# Patient Record
Sex: Female | Born: 1985
Health system: Southern US, Community
[De-identification: ages and names within clinical notes are randomized; demographics above are authoritative.]

## PROBLEM LIST (undated history)

## (undated) DIAGNOSIS — N6019 Diffuse cystic mastopathy of unspecified breast: Secondary | ICD-10-CM

## (undated) DIAGNOSIS — O09299 Supervision of pregnancy with other poor reproductive or obstetric history, unspecified trimester: Secondary | ICD-10-CM

## (undated) DIAGNOSIS — Z9889 Other specified postprocedural states: Secondary | ICD-10-CM

## (undated) DIAGNOSIS — D241 Benign neoplasm of right breast: Secondary | ICD-10-CM

## (undated) DIAGNOSIS — K649 Unspecified hemorrhoids: Secondary | ICD-10-CM

## (undated) HISTORY — PX: ANTERIOR CRUCIATE LIGAMENT REPAIR: SHX115

## (undated) HISTORY — DX: Benign neoplasm of right breast: D24.1

## (undated) HISTORY — DX: Supervision of pregnancy with other poor reproductive or obstetric history, unspecified trimester: O09.299

## (undated) HISTORY — DX: Other specified postprocedural states: Z98.890

## (undated) HISTORY — DX: Unspecified hemorrhoids: K64.9

## (undated) HISTORY — DX: Diffuse cystic mastopathy of unspecified breast: N60.19

---

## 2009-11-28 DIAGNOSIS — N6019 Diffuse cystic mastopathy of unspecified breast: Secondary | ICD-10-CM

## 2009-11-28 HISTORY — DX: Diffuse cystic mastopathy of unspecified breast: N60.19

## 2010-06-21 ENCOUNTER — Encounter: Admission: RE | Admit: 2010-06-21 | Discharge: 2010-06-21 | Payer: Self-pay | Admitting: Obstetrics and Gynecology

## 2010-11-28 HISTORY — PX: OTHER SURGICAL HISTORY: SHX169

## 2011-05-19 ENCOUNTER — Other Ambulatory Visit: Payer: Self-pay | Admitting: Obstetrics and Gynecology

## 2011-05-19 DIAGNOSIS — N6311 Unspecified lump in the right breast, upper outer quadrant: Secondary | ICD-10-CM

## 2011-05-24 ENCOUNTER — Ambulatory Visit (HOSPITAL_COMMUNITY)
Admission: RE | Admit: 2011-05-24 | Discharge: 2011-05-24 | Disposition: A | Discharge status: Home / Self Care | Payer: 59 | Source: Ambulatory Visit | Attending: Obstetrics and Gynecology | Admitting: Obstetrics and Gynecology

## 2011-05-24 ENCOUNTER — Other Ambulatory Visit: Payer: Self-pay | Admitting: Obstetrics and Gynecology

## 2011-05-24 DIAGNOSIS — N920 Excessive and frequent menstruation with regular cycle: Secondary | ICD-10-CM | POA: Insufficient documentation

## 2011-05-24 DIAGNOSIS — N840 Polyp of corpus uteri: Secondary | ICD-10-CM | POA: Insufficient documentation

## 2011-05-24 LAB — PREGNANCY, URINE: Preg Test, Ur: NEGATIVE

## 2011-05-26 ENCOUNTER — Ambulatory Visit
Admission: RE | Admit: 2011-05-26 | Discharge: 2011-05-26 | Disposition: A | Discharge status: Home / Self Care | Payer: 59 | Source: Ambulatory Visit | Attending: Obstetrics and Gynecology | Admitting: Obstetrics and Gynecology

## 2011-05-26 DIAGNOSIS — N6311 Unspecified lump in the right breast, upper outer quadrant: Secondary | ICD-10-CM

## 2011-06-04 NOTE — Op Note (Signed)
Melissa Shah, Melissa Shah                  ACCOUNT NO.:  [id number]  MEDICAL RECORD NO.:  [id number]  LOCATION:  [facility]                          FACILITY:  [facility]  PHYSICIAN:  Randye Lobo, M.D.   DATE OF BIRTH:  04-13-1986  DATE OF PROCEDURE:  05/24/2011 DATE OF DISCHARGE:                              OPERATIVE REPORT   PREOPERATIVE DIAGNOSIS:  Menometrorrhagia, endometrial polyp.  POSTOPERATIVE DIAGNOSIS:  Menometrorrhagia, endometrial polyp.  PROCEDURE:  Hysteroscopy, dilation and curettage, polypectomy.  SURGEON:  Randye Lobo, MD  ANESTHESIA:  LMA, paracervical block with 1% lidocaine.  IV FLUIDS:  1500 mL Ringer lactate.  ESTIMATED BLOOD LOSS:  Minimal.  URINE OUTPUT:  30 mL.  COMPLICATIONS:  None.  INDICATIONS FOR THE PROCEDURE:  The patient is a 25 year old gravida 0 African American female who presents with recurrent heavy and prolonged vaginal bleeding.  The patient had a saline ultrasound performed in December 2011 documenting a lower uterine segment 1 cm polyp.  The patient temporarily had improvement of her bleeding; however, she now presents with a 36-month history of prolonged and now heavy bleeding. Repeat ultrasound on May 19, 2011, documented an endometrial stripe of 9.75 mm.  No fibroids were noted.  The ovaries were noted to be normal. A plan is now made to proceed with a hysteroscopy D and C after risks, benefits, and alternatives are reviewed.  FINDINGS:  Hysteroscopy demonstrated fluffy endometrium with a polypoid projection from the anterior lower uterine segment at the 12 o'clock position.  There were no fibroids appreciated.  The left tubal ostia was visualized well, but the right was not.  There was no evidence of any intracavitary fibroids.  SPECIMENS:  Endometrial curettings were sent to pathology.  PROCEDURE IN DETAIL:  The patient was reidentified in the preop hold area.  She received Ancef 1 g IV for antibiotic prophylaxis and TED hose for  DVT prophylaxis.  In the operating room, an LMA anesthetic was administered and the patient was placed in a dorsal lithotomy position.  The vagina and perineum and vulva were sterilely prepped and draped.  The patient was I and O cathed with a red rubber catheter.  An exam under anesthesia was performed.  The uterus was noted to be small and mid position.  No masses were appreciated.  A speculum was placed inside the vagina and a single-tooth tenaculum was placed on the anterior cervical lip.  A paracervical block was performed in standard fashion with a total of 10 mL of 1% lidocaine.  The uterus was sounded to 8 cm.  The cervix was dilated to a #21 Pratt dilator. The diagnostic hysteroscope was inserted under the continuous infusion of glycine solution.  The findings are as noted above.  The hysteroscope was withdrawn and the cervix was dilated to a #23 Pratt dilator.  A serrated curette was used to curette all four quadrants of the endometrium including the lower uterine segment.  Placement of the hysteroscope with intracavitary glycine at this time and documented some continued fluffy endometrium.  The hysteroscope was therefore withdrawn and a sharp curette was used to curette one final time in all four quadrants and the lower  uterine segment.  Additional tissue was obtained.  The endometrial curettings were sent to pathology.  The tenaculum was removed and the cervix demonstrated good hemostasis.  All vaginal instruments were removed.  The patient was awakened and escorted to the recovery room in stable condition.  There were no complications to the procedure.  All needle, instrument, and sponge counts were correct.     Randye Lobo, M.D.     BES/MEDQ  D:  05/24/2011  T:  05/25/2011  Job:  [id number]  Electronically Signed by Conley Simmonds M.D. on 06/04/2011 09:07:06 AM

## 2011-11-29 DIAGNOSIS — Z9889 Other specified postprocedural states: Secondary | ICD-10-CM

## 2011-11-29 HISTORY — DX: Other specified postprocedural states: Z98.890

## 2012-06-14 ENCOUNTER — Other Ambulatory Visit: Payer: Self-pay | Admitting: Family Medicine

## 2012-06-14 ENCOUNTER — Ambulatory Visit
Admission: RE | Admit: 2012-06-14 | Discharge: 2012-06-14 | Disposition: A | Discharge status: Home / Self Care | Payer: 59 | Source: Ambulatory Visit | Attending: Family Medicine | Admitting: Family Medicine

## 2012-06-14 DIAGNOSIS — R52 Pain, unspecified: Secondary | ICD-10-CM

## 2012-06-21 ENCOUNTER — Other Ambulatory Visit: Payer: Self-pay | Admitting: Obstetrics and Gynecology

## 2012-06-21 DIAGNOSIS — Z1231 Encounter for screening mammogram for malignant neoplasm of breast: Secondary | ICD-10-CM

## 2012-06-21 DIAGNOSIS — N63 Unspecified lump in unspecified breast: Secondary | ICD-10-CM

## 2012-06-29 ENCOUNTER — Other Ambulatory Visit: Payer: 59

## 2012-07-11 ENCOUNTER — Ambulatory Visit
Admission: RE | Admit: 2012-07-11 | Discharge: 2012-07-11 | Disposition: A | Discharge status: Home / Self Care | Payer: 59 | Source: Ambulatory Visit | Attending: Obstetrics and Gynecology | Admitting: Obstetrics and Gynecology

## 2012-07-11 DIAGNOSIS — N63 Unspecified lump in unspecified breast: Secondary | ICD-10-CM

## 2013-11-18 ENCOUNTER — Ambulatory Visit (INDEPENDENT_AMBULATORY_CARE_PROVIDER_SITE_OTHER): Payer: 59 | Admitting: *Deleted

## 2013-11-18 ENCOUNTER — Encounter: Payer: Self-pay | Admitting: *Deleted

## 2013-11-18 VITALS — BP 115/84 | Ht 62.0 in | Wt 174.0 lb

## 2013-11-18 DIAGNOSIS — B951 Streptococcus, group B, as the cause of diseases classified elsewhere: Secondary | ICD-10-CM

## 2013-11-18 DIAGNOSIS — Z3400 Encounter for supervision of normal first pregnancy, unspecified trimester: Secondary | ICD-10-CM

## 2013-11-18 DIAGNOSIS — Z3401 Encounter for supervision of normal first pregnancy, first trimester: Secondary | ICD-10-CM

## 2013-11-18 DIAGNOSIS — O2390 Unspecified genitourinary tract infection in pregnancy, unspecified trimester: Secondary | ICD-10-CM

## 2013-11-18 LAB — OB RESULTS CONSOLE ABO/RH: RH Type: POSITIVE

## 2013-11-18 LAB — OB RESULTS CONSOLE RPR: RPR: NONREACTIVE

## 2013-11-18 LAB — OB RESULTS CONSOLE HIV ANTIBODY (ROUTINE TESTING): HIV: NONREACTIVE

## 2013-11-18 LAB — OB RESULTS CONSOLE RUBELLA ANTIBODY, IGM: Rubella: IMMUNE

## 2013-11-18 NOTE — Progress Notes (Signed)
P-75 

## 2013-11-18 NOTE — Patient Instructions (Signed)

## 2013-11-19 LAB — PRENATAL PROFILE I(LABCORP)
Antibody Screen: NEGATIVE
Basophils Absolute: 0 10*3/uL (ref 0.0–0.2)
Basos: 0 %
Eos: 3 %
Eosinophils Absolute: 0.2 10*3/uL (ref 0.0–0.4)
HCT: 37.8 % (ref 34.0–46.6)
Hemoglobin: 12.4 g/dL (ref 11.1–15.9)
Hepatitis B Surface Ag: NEGATIVE
Immature Grans (Abs): 0 10*3/uL (ref 0.0–0.1)
Immature Granulocytes: 0 %
Lymphocytes Absolute: 2.1 10*3/uL (ref 0.7–3.1)
Lymphs: 31 %
MCH: 28.1 pg (ref 26.6–33.0)
MCHC: 32.8 g/dL (ref 31.5–35.7)
MCV: 86 fL (ref 79–97)
Monocytes Absolute: 0.5 10*3/uL (ref 0.1–0.9)
Monocytes: 7 %
Neutrophils Absolute: 4 10*3/uL (ref 1.4–7.0)
Neutrophils Relative %: 59 %
Platelets: 385 10*3/uL — ABNORMAL HIGH (ref 150–379)
RBC: 4.42 x10E6/uL (ref 3.77–5.28)
RDW: 14.3 % (ref 12.3–15.4)
RPR: NONREACTIVE
Rh Factor: POSITIVE
Rubella Antibodies, IGG: 1.75 {index}
WBC: 6.8 10*3/uL (ref 3.4–10.8)

## 2013-11-19 LAB — HIV ANTIBODY (ROUTINE TESTING W REFLEX)
HIV 1/O/2 Abs-Index Value: <1
HIV-1/HIV-2 Ab: NONREACTIVE

## 2013-11-21 LAB — CULTURE, URINE COMPREHENSIVE

## 2013-11-25 ENCOUNTER — Ambulatory Visit (INDEPENDENT_AMBULATORY_CARE_PROVIDER_SITE_OTHER): Payer: 59 | Admitting: Obstetrics & Gynecology

## 2013-11-25 ENCOUNTER — Encounter: Payer: Self-pay | Admitting: *Deleted

## 2013-11-25 ENCOUNTER — Encounter: Payer: Self-pay | Admitting: Obstetrics & Gynecology

## 2013-11-25 VITALS — BP 130/83 | Wt 174.0 lb

## 2013-11-25 DIAGNOSIS — Z3490 Encounter for supervision of normal pregnancy, unspecified, unspecified trimester: Secondary | ICD-10-CM | POA: Insufficient documentation

## 2013-11-25 DIAGNOSIS — Z3491 Encounter for supervision of normal pregnancy, unspecified, first trimester: Secondary | ICD-10-CM

## 2013-11-25 DIAGNOSIS — B951 Streptococcus, group B, as the cause of diseases classified elsewhere: Secondary | ICD-10-CM | POA: Insufficient documentation

## 2013-11-25 DIAGNOSIS — Z3480 Encounter for supervision of other normal pregnancy, unspecified trimester: Secondary | ICD-10-CM

## 2013-11-25 LAB — OB RESULTS CONSOLE GC/CHLAMYDIA
Chlamydia: NEGATIVE
Gonorrhea: NEGATIVE

## 2013-11-25 MED ORDER — AMOXICILLIN 500 MG PO CAPS: 500.0000 mg | ORAL_CAPSULE | Freq: Three times a day (TID) | ORAL | Status: DC

## 2013-11-25 MED ORDER — COMPLETE NATAL DHA 29-1-200 & 250 MG PO MISC: 1.0000 | Freq: Every day | ORAL | Status: DC

## 2013-11-25 NOTE — Addendum Note (Signed)
Addended by: Jaynie Collins A on: 11/25/2013 01:11 PM   Modules accepted: Orders

## 2013-11-25 NOTE — Progress Notes (Signed)
   Subjective:    Melissa Shah is a G1P0 at [redacted]w[redacted]d being seen today for her first obstetrical visit.  Patient does intend to breast feed. Pregnancy history fully reviewed.  Patient reports no complaints.  Filed Vitals:   11/25/13 1601  BP: 130/83  Weight: 174 lb (78.926 kg)    HISTORY: OB History  Gravida Para Term Preterm AB SAB TAB Ectopic Multiple Living  1             # Outcome Date GA Lbr Len/2nd Weight Sex Delivery Anes PTL Lv  1 CUR              Past Medical History  Diagnosis Date  . S/P ACL surgery 2013    complete replacement cadaver acl  . Fibrocystic breast 2011    2011-2013 monitored   Past Surgical History  Procedure Laterality Date  . Cervical polyp removal  2012   Family History  Problem Relation Age of Onset  . Fibroids Mother   . Fibrocystic breast disease Mother   . Diabetes Father   . Hypertension Maternal Grandmother      Exam    Uterus:  7 week size on clinic ultrasound +fetal pole +cardiac activity  Pelvic Exam: Deferred as per patient's preference, normal pap in 05/2013  System: Breast:  normal appearance, no masses or tenderness   Skin: normal coloration and turgor, no rashes   Neurologic: oriented, normal   Extremities: normal strength, tone, and muscle mass   HEENT PERRLA   Mouth/Teeth mucous membranes moist, pharynx normal without lesions and dental hygiene good   Neck supple and no masses   Cardiovascular: regular rate and rhythm   Respiratory:  appears well, vitals normal, no respiratory distress, acyanotic, normal RR, chest clear, no wheezing, crepitations, rhonchi, normal symmetric air entry   Abdomen: soft, non-tender; bowel sounds normal; no masses,  no organomegaly     Assessment:    Pregnancy: G1P0 Patient Active Problem List   Diagnosis Date Noted  . Group b Streptococcus urinary tract infection complicating pregnancy in first trimester 11/25/2013  . Supervision of normal pregnancy 11/25/2013     Plan:   Initial  labs drawn, Amoxicillin given for GBS UTI, will treat again in labor Continue prenatal vitamins. Problem list reviewed and updated. Genetic Screening discussed First Screen: ordered. Ultrasound discussed; fetal survey: to be ordered later. Follow up in 4 weeks.    Tereso Newcomer , M.D.  11/25/2013

## 2013-11-25 NOTE — Progress Notes (Signed)
P = 76 

## 2013-11-25 NOTE — Patient Instructions (Addendum)
Pregnancy - First Trimester During sexual intercourse, millions of sperm go into the vagina. Only 1 sperm will penetrate and fertilize the female egg while it is in the Fallopian tube. One week later, the fertilized egg implants into the wall of the uterus. An embryo begins to develop into a baby. At 6 to 8 weeks, the eyes and face are formed and the heartbeat can be seen on ultrasound. At the end of 12 weeks (first trimester), all the baby's organs are formed. Now that you are pregnant, you will want to do everything you can to have a healthy baby. Two of the most important things are to get good prenatal care and follow your caregiver's instructions. Prenatal care is all the medical care you receive before the baby's birth. It is given to prevent, find, and treat problems during the pregnancy and childbirth. PRENATAL EXAMS  During prenatal visits, your weight, blood pressure, and urine are checked. This is done to make sure you are healthy and progressing normally during the pregnancy.  A pregnant woman should gain 25 to 35 pounds during the pregnancy. However, if you are overweight or underweight, your caregiver will advise you regarding your weight.  Your caregiver will ask and answer questions for you.  Blood work, cervical cultures, other necessary tests, and a Pap test are done during your prenatal exams. These tests are done to check on your health and the probable health of your baby. Tests are strongly recommended and done for HIV with your permission. This is the virus that causes AIDS. These tests are done because medicines can be given to help prevent your baby from being born with this infection should you have been infected without knowing it. Blood work is also used to find out your blood type, previous infections, and follow your blood levels (hemoglobin).  Low hemoglobin (anemia) is common during pregnancy. Iron and vitamins are given to help prevent this. Later in the pregnancy,  blood tests for diabetes will be done along with any other tests if any problems develop.  You may need other tests to make sure you and the baby are doing well. CHANGES DURING THE FIRST TRIMESTER  Your body goes through many changes during pregnancy. They vary from person to person. Talk to your caregiver about changes you notice and are concerned about. Changes can include:  Your menstrual period stops.  The egg and sperm carry the genes that determine what you look like. Genes from you and your partner are forming a baby. The female genes determine whether the baby is a boy or a girl.  Your body increases in girth and you may feel bloated.  Feeling sick to your stomach (nauseous) and throwing up (vomiting). If the vomiting is uncontrollable, call your caregiver.  Your breasts will begin to enlarge and become tender.  Your nipples may stick out more and become darker.  The need to urinate more. Painful urination may mean you have a bladder infection.  Tiring easily.  Loss of appetite.  Cravings for certain kinds of food.  At first, you may gain or lose a couple of pounds.  You may have changes in your emotions from day to day (excited to be pregnant or concerned something may go wrong with the pregnancy and baby).  You may have more vivid and strange dreams. HOME CARE INSTRUCTIONS   It is very important to avoid all smoking, alcohol and non-prescribed drugs during your pregnancy. These affect the formation and growth of the baby.   Avoid chemicals while pregnant to ensure the delivery of a healthy infant.  Start your prenatal visits by the 12th week of pregnancy. They are usually scheduled monthly at first, then more often in the last 2 months before delivery. Keep your caregiver's appointments. Follow your caregiver's instructions regarding medicine use, blood and lab tests, exercise, and diet.  During pregnancy, you are providing food for you and your baby. Eat regular,  well-balanced meals. Choose foods such as meat, fish, milk and other low fat dairy products, vegetables, fruits, and whole-grain breads and cereals. Your caregiver will tell you of the ideal weight gain.  You can help morning sickness by keeping soda crackers at the bedside. Eat a couple before arising in the morning. You may want to use the crackers without salt on them.  Eating 4 to 5 small meals rather than 3 large meals a day also may help the nausea and vomiting.  Drinking liquids between meals instead of during meals also seems to help nausea and vomiting.  A physical sexual relationship may be continued throughout pregnancy if there are no other problems. Problems may be early (premature) leaking of amniotic fluid from the membranes, vaginal bleeding, or belly (abdominal) pain.  Exercise regularly if there are no restrictions. Check with your caregiver or physical therapist if you are unsure of the safety of some of your exercises. Greater weight gain will occur in the last 2 trimesters of pregnancy. Exercising will help:  Control your weight.  Keep you in shape.  Prepare you for labor and delivery.  Help you lose your pregnancy weight after you deliver your baby.  Wear a good support or jogging bra for breast tenderness during pregnancy. This may help if worn during sleep too.  Ask when prenatal classes are available. Begin classes when they are offered.  Do not use hot tubs, steam rooms, or saunas.  Wear your seat belt when driving. This protects you and your baby if you are in an accident.  Avoid raw meat, uncooked cheese, cat litter boxes, and soil used by cats throughout the pregnancy. These carry germs that can cause birth defects in the baby.  The first trimester is a good time to visit your dentist for your dental health. Getting your teeth cleaned is okay. Use a softer toothbrush and brush gently during pregnancy.  Ask for help if you have financial, counseling, or  nutritional needs during pregnancy. Your caregiver will be able to offer counseling for these needs as well as refer you for other special needs.  Do not take any medicines or herbs unless told by your caregiver.  Inform your caregiver if there is any mental or physical domestic violence.  Make a list of emergency phone numbers of family, friends, hospital, and police and fire departments.  Write down your questions. Take them to your prenatal visit.  Do not douche.  Do not cross your legs.  If you have to stand for long periods of time, rotate you feet or take small steps in a circle.  You may have more vaginal secretions that may require a sanitary pad. Do not use tampons or scented sanitary pads. MEDICINES AND DRUG USE IN PREGNANCY  Take prenatal vitamins as directed. The vitamin should contain 1 milligram of folic acid. Keep all vitamins out of reach of children. Only a couple vitamins or tablets containing iron may be fatal to a baby or young child when ingested.  Avoid use of all medicines, including herbs, over-the-counter medicines, not   prescribed or suggested by your caregiver. Only take over-the-counter or prescription medicines for pain, discomfort, or fever as directed by your caregiver. Do not use aspirin, ibuprofen, or naproxen unless directed by your caregiver.  Let your caregiver also know about herbs you may be using.  Alcohol is related to a number of birth defects. This includes fetal alcohol syndrome. All alcohol, in any form, should be avoided completely. Smoking will cause low birth rate and premature babies.  Street or illegal drugs are very harmful to the baby. They are absolutely forbidden. A baby born to an addicted mother will be addicted at birth. The baby will go through the same withdrawal an adult does.  Let your caregiver know about any medicines that you have to take and for what reason you take them. SEEK MEDICAL CARE IF:  You have any concerns or  worries during your pregnancy. It is better to call with your questions if you feel they cannot wait, rather than worry about them. SEEK IMMEDIATE MEDICAL CARE IF:   An unexplained oral temperature above 102 F (38.9 C) develops, or as your caregiver suggests.  You have leaking of fluid from the vagina (birth canal). If leaking membranes are suspected, take your temperature and inform your caregiver of this when you call.  There is vaginal spotting or bleeding. Notify your caregiver of the amount and how many pads are used.  You develop a bad smelling vaginal discharge with a change in the color.  You continue to feel sick to your stomach (nauseated) and have no relief from remedies suggested. You vomit blood or coffee ground-like materials.  You lose more than 2 pounds of weight in 1 week.  You gain more than 2 pounds of weight in 1 week and you notice swelling of your face, hands, feet, or legs.  You gain 5 pounds or more in 1 week (even if you do not have swelling of your hands, face, legs, or feet).  You get exposed to [place] measles and have never had them.  You are exposed to fifth disease or chickenpox.  You develop belly (abdominal) pain. Round ligament discomfort is a common non-cancerous (benign) cause of abdominal pain in pregnancy. Your caregiver still must evaluate this.  You develop headache, fever, diarrhea, pain with urination, or shortness of breath.  You fall or are in a car accident or have any kind of trauma.  There is mental or physical violence in your home. Document Released: 11/08/2001 Document Revised: 08/08/2012 Document Reviewed: 05/12/2009 [facility] Patient Information 2014 [place], [place].  Second Trimester of Pregnancy The second trimester is from week 13 through week 28, months 4 through 6. The second trimester is often a time when you feel your best. Your body has also adjusted to being pregnant, and you begin to feel better physically. Usually,  morning sickness has lessened or quit completely, you may have more energy, and you may have an increase in appetite. The second trimester is also a time when the fetus is growing rapidly. At the end of the sixth month, the fetus is about 9 inches long and weighs about 1 pounds. You will likely begin to feel the baby move (quickening) between 18 and 20 weeks of the pregnancy. BODY CHANGES Your body goes through many changes during pregnancy. The changes vary from woman to woman.   Your weight will continue to increase. You will notice your lower abdomen bulging out.  You may begin to get stretch marks on your hips, abdomen,  and breasts.  You may develop headaches that can be relieved by medicines approved by your caregiver.  You may urinate more often because the fetus is pressing on your bladder.  You may develop or continue to have heartburn as a result of your pregnancy.  You may develop constipation because certain hormones are causing the muscles that push waste through your intestines to slow down.  You may develop hemorrhoids or swollen, bulging veins (varicose veins).  You may have back pain because of the weight gain and pregnancy hormones relaxing your joints between the bones in your pelvis and as a result of a shift in weight and the muscles that support your balance.  Your breasts will continue to grow and be tender.  Your gums may bleed and may be sensitive to brushing and flossing.  Dark spots or blotches (chloasma, mask of pregnancy) may develop on your face. This will likely fade after the baby is born.  A dark line from your belly button to the pubic area (linea nigra) may appear. This will likely fade after the baby is born. WHAT TO EXPECT AT YOUR PRENATAL VISITS During a routine prenatal visit:  You will be weighed to make sure you and the fetus are growing normally.  Your blood pressure will be taken.  Your abdomen will be measured to track your baby's  growth.  The fetal heartbeat will be listened to.  Any test results from the previous visit will be discussed. Your caregiver may ask you:  How you are feeling.  If you are feeling the baby move.  If you have had any abnormal symptoms, such as leaking fluid, bleeding, severe headaches, or abdominal cramping.  If you have any questions. Other tests that may be performed during your second trimester include:  Blood tests that check for:  Low iron levels (anemia).  Gestational diabetes (between 24 and 28 weeks).  Rh antibodies.  Urine tests to check for infections, diabetes, or protein in the urine.  An ultrasound to confirm the proper growth and development of the baby.  An amniocentesis to check for possible genetic problems.  Fetal screens for spina bifida and Down syndrome. HOME CARE INSTRUCTIONS   Avoid all smoking, herbs, alcohol, and unprescribed drugs. These chemicals affect the formation and growth of the baby.  Follow your caregiver's instructions regarding medicine use. There are medicines that are either safe or unsafe to take during pregnancy.  Exercise only as directed by your caregiver. Experiencing uterine cramps is a good sign to stop exercising.  Continue to eat regular, healthy meals.  Wear a good support bra for breast tenderness.  Do not use hot tubs, steam rooms, or saunas.  Wear your seat belt at all times when driving.  Avoid raw meat, uncooked cheese, cat litter boxes, and soil used by cats. These carry germs that can cause birth defects in the baby.  Take your prenatal vitamins.  Try taking a stool softener (if your caregiver approves) if you develop constipation. Eat more high-fiber foods, such as fresh vegetables or fruit and whole grains. Drink plenty of fluids to keep your urine clear or pale yellow.  Take warm sitz baths to soothe any pain or discomfort caused by hemorrhoids. Use hemorrhoid cream if your caregiver approves.  If you  develop varicose veins, wear support hose. Elevate your feet for 15 minutes, 3 4 times a day. Limit salt in your diet.  Avoid heavy lifting, wear low heel shoes, and practice good posture.  Rest  with your legs elevated if you have leg cramps or low back pain.  Visit your dentist if you have not gone yet during your pregnancy. Use a soft toothbrush to brush your teeth and be gentle when you floss.  A sexual relationship may be continued unless your caregiver directs you otherwise.  Continue to go to all your prenatal visits as directed by your caregiver. SEEK MEDICAL CARE IF:   You have dizziness.  You have mild pelvic cramps, pelvic pressure, or nagging pain in the abdominal area.  You have persistent nausea, vomiting, or diarrhea.  You have a bad smelling vaginal discharge.  You have pain with urination. SEEK IMMEDIATE MEDICAL CARE IF:   You have a fever.  You are leaking fluid from your vagina.  You have spotting or bleeding from your vagina.  You have severe abdominal cramping or pain.  You have rapid weight gain or loss.  You have shortness of breath with chest pain.  You notice sudden or extreme swelling of your face, hands, ankles, feet, or legs.  You have not felt your baby move in over an hour.  You have severe headaches that do not go away with medicine.  You have vision changes. Document Released: 11/08/2001 Document Revised: 07/17/2013 Document Reviewed: 01/15/2013 [facility] Patient Information 2014 [place], [place].  Breastfeeding Deciding to breastfeed is one of the best choices you can make for you and your baby. A change in hormones during pregnancy causes your breast tissue to grow and increases the number and size of your milk ducts. These hormones also allow proteins, sugars, and fats from your blood supply to make breast milk in your milk-producing glands. Hormones prevent breast milk from being released before your baby is born as well as prompt milk  flow after birth. Once breastfeeding has begun, thoughts of your baby, as well as his or her sucking or crying, can stimulate the release of milk from your milk-producing glands.  BENEFITS OF BREASTFEEDING For Your Baby  Your first milk (colostrum) helps your baby's digestive system function better.   There are antibodies in your milk that help your baby fight off infections.   Your baby has a lower incidence of asthma, allergies, and sudden infant death syndrome.   The nutrients in breast milk are better for your baby than infant formulas and are designed uniquely for your baby's needs.   Breast milk improves your baby's brain development.   Your baby is less likely to develop other conditions, such as childhood obesity, asthma, or type 2 diabetes mellitus.  For You   Breastfeeding helps to create a very special bond between you and your baby.   Breastfeeding is convenient. Breast milk is always available at the correct temperature and costs nothing.   Breastfeeding helps to burn calories and helps you lose the weight gained during pregnancy.   Breastfeeding makes your uterus contract to its prepregnancy size faster and slows bleeding (lochia) after you give birth.   Breastfeeding helps to lower your risk of developing type 2 diabetes mellitus, osteoporosis, and breast or ovarian cancer later in life. SIGNS THAT YOUR BABY IS HUNGRY Early Signs of Hunger  Increased alertness or activity.  Stretching.  Movement of the head from side to side.  Movement of the head and opening of the mouth when the corner of the mouth or cheek is stroked (rooting).  Increased sucking sounds, smacking lips, cooing, sighing, or squeaking.  Hand-to-mouth movements.  Increased sucking of fingers or hands. Late Signs  of Hunger  Fussing.  Intermittent crying. Extreme Signs of Hunger Signs of extreme hunger will require calming and consoling before your baby will be able to breastfeed  successfully. Do not wait for the following signs of extreme hunger to occur before you initiate breastfeeding:   Restlessness.  A loud, strong cry.   Screaming. BREASTFEEDING BASICS Breastfeeding Initiation  Find a comfortable place to sit or lie down, with your neck and back well supported.  Place a pillow or rolled up blanket under your baby to bring him or her to the level of your breast (if you are seated). Nursing pillows are specially designed to help support your arms and your baby while you breastfeed.  Make sure that your baby's abdomen is facing your abdomen.   Gently massage your breast. With your fingertips, massage from your chest wall toward your nipple in a circular motion. This encourages milk flow. You may need to continue this action during the feeding if your milk flows slowly.  Support your breast with 4 fingers underneath and your thumb above your nipple. Make sure your fingers are well away from your nipple and your baby's mouth.   Stroke your baby's lips gently with your finger or nipple.   When your baby's mouth is open wide enough, quickly bring your baby to your breast, placing your entire nipple and as much of the colored area around your nipple (areola) as possible into your baby's mouth.   More areola should be visible above your baby's upper lip than below the lower lip.   Your baby's tongue should be between his or her lower gum and your breast.   Ensure that your baby's mouth is correctly positioned around your nipple (latched). Your baby's lips should create a seal on your breast and be turned out (everted).  It is common for your baby to suck about 2 3 minutes in order to start the flow of breast milk. Latching Teaching your baby how to latch on to your breast properly is very important. An improper latch can cause nipple pain and decreased milk supply for you and poor weight gain in your baby. Also, if your baby is not latched onto your  nipple properly, he or she may swallow some air during feeding. This can make your baby fussy. Burping your baby when you switch breasts during the feeding can help to get rid of the air. However, teaching your baby to latch on properly is still the best way to prevent fussiness from swallowing air while breastfeeding. Signs that your baby has successfully latched on to your nipple:    Silent tugging or silent sucking, without causing you pain.   Swallowing heard between every 3 4 sucks.    Muscle movement above and in front of his or her ears while sucking.  Signs that your baby has not successfully latched on to nipple:   Sucking sounds or smacking sounds from your baby while breastfeeding.  Nipple pain. If you think your baby has not latched on correctly, slip your finger into the corner of your baby's mouth to break the suction and place it between your baby's gums. Attempt breastfeeding initiation again. Signs of Successful Breastfeeding Signs from your baby:   A gradual decrease in the number of sucks or complete cessation of sucking.   Falling asleep.   Relaxation of his or her body.   Retention of a small amount of milk in his or her mouth.   Letting go of your breast  by himself or herself. Signs from you:  Breasts that have increased in firmness, weight, and size 1 3 hours after feeding.   Breasts that are softer immediately after breastfeeding.  Increased milk volume, as well as a change in milk consistency and color by the 5th day of breastfeeding.   Nipples that are not sore, cracked, or bleeding. Signs That Your Pecola Leisure is Getting Enough Milk  Wetting at least 3 diapers in a 24-hour period. The urine should be clear and pale yellow by age 281 days.  At least 3 stools in a 24-hour period by age 281 days. The stool should be soft and yellow.  At least 3 stools in a 24-hour period by age 29 days. The stool should be seedy and yellow.  No loss of weight greater  than 10% of birth weight during the first 38 days of age.  Average weight gain of 4 7 ounces (120 210 mL) per week after age 922 days.  Consistent daily weight gain by age 281 days, without weight loss after the age of 2 weeks. After a feeding, your baby may spit up a small amount. This is common. BREASTFEEDING FREQUENCY AND DURATION Frequent feeding will help you make more milk and can prevent sore nipples and breast engorgement. Breastfeed when you feel the need to reduce the fullness of your breasts or when your baby shows signs of hunger. This is called "breastfeeding on demand." Avoid introducing a pacifier to your baby while you are working to establish breastfeeding (the first 4 6 weeks after your baby is born). After this time you may choose to use a pacifier. Research has shown that pacifier use during the first year of a baby's life decreases the risk of sudden infant death syndrome (SIDS). Allow your baby to feed on each breast as long as he or she wants. Breastfeed until your baby is finished feeding. When your baby unlatches or falls asleep while feeding from the first breast, offer the second breast. Because newborns are often sleepy in the first few weeks of life, you may need to awaken your baby to get him or her to feed. Breastfeeding times will vary from baby to baby. However, the following rules can serve as a guide to help you ensure that your baby is properly fed:  Newborns (babies 54 weeks of age or younger) may breastfeed every 1 3 hours.  Newborns should not go longer than 3 hours during the day or 5 hours during the night without breastfeeding.  You should breastfeed your baby a minimum of 8 times in a 24-hour period until you begin to introduce solid foods to your baby at around 63 months of age. BREAST MILK PUMPING Pumping and storing breast milk allows you to ensure that your baby is exclusively fed your breast milk, even at times when you are unable to breastfeed. This is  especially important if you are going back to work while you are still breastfeeding or when you are not able to be present during feedings. Your lactation consultant can give you guidelines on how long it is safe to store breast milk.  A breast pump is a machine that allows you to pump milk from your breast into a sterile bottle. The pumped breast milk can then be stored in a refrigerator or freezer. Some breast pumps are operated by hand, while others use electricity. Ask your lactation consultant which type will work best for you. Breast pumps can be purchased, but some hospitals and  breastfeeding support groups lease breast pumps on a monthly basis. A lactation consultant can teach you how to hand express breast milk, if you prefer not to use a pump.  CARING FOR YOUR BREASTS WHILE YOU BREASTFEED Nipples can become dry, cracked, and sore while breastfeeding. The following recommendations can help keep your breasts moisturized and healthy:  Avoid using soap on your nipples.   Wear a supportive bra. Although not required, special nursing bras and tank tops are designed to allow access to your breasts for breastfeeding without taking off your entire bra or top. Avoid wearing underwire style bras or extremely tight bras.  Air dry your nipples for 3 after each feeding.   Use only cotton bra pads to absorb leaked breast milk. Leaking of breast milk between feedings is normal.   Use lanolin on your nipples after breastfeeding. Lanolin helps to maintain your skin's normal moisture barrier. If you use pure lanolin you do not need to wash it off before feeding your baby again. Pure lanolin is not toxic to your baby. You may also hand express a few drops of breast milk and gently massage that milk into your nipples and allow the milk to air dry. In the first few weeks after giving birth, some women experience extremely full breasts (engorgement). Engorgement can make your breasts feel heavy, warm,  and tender to the touch. Engorgement peaks within 3 5 days after you give birth. The following recommendations can help ease engorgement:  Completely empty your breasts while breastfeeding or pumping. You may want to start by applying warm, moist heat (in the shower or with warm water-soaked hand towels) just before feeding or pumping. This increases circulation and helps the milk flow. If your baby does not completely empty your breasts while breastfeeding, pump any extra milk after he or she is finished.  Wear a snug bra (nursing or regular) or tank top for 1 2 days to signal your body to slightly decrease milk production.  Apply ice packs to your breasts, unless this is too uncomfortable for you.  Make sure that your baby is latched on and positioned properly while breastfeeding. If engorgement persists after 48 hours of following these recommendations, contact your health care provider or a Advertising copywriter. OVERALL HEALTH CARE RECOMMENDATIONS WHILE BREASTFEEDING  Eat healthy foods. Alternate between meals and snacks, eating 3 of each per day. Because what you eat affects your breast milk, some of the foods may make your baby more irritable than usual. Avoid eating these foods if you are sure that they are negatively affecting your baby.  Drink milk, fruit juice, and water to satisfy your thirst (about 10 glasses a day).   Rest often, relax, and continue to take your prenatal vitamins to prevent fatigue, stress, and anemia.  Continue breast self-awareness checks.  Avoid chewing and smoking tobacco.  Avoid alcohol and drug use. Some medicines that may be harmful to your baby can pass through breast milk. It is important to ask your health care provider before taking any medicine, including all over-the-counter and prescription medicine as well as vitamin and herbal supplements. It is possible to become pregnant while breastfeeding. If birth control is desired, ask your health care  provider about options that will be safe for your baby. SEEK MEDICAL CARE IF:   You feel like you want to stop breastfeeding or have become frustrated with breastfeeding.  You have painful breasts or nipples.  Your nipples are cracked or bleeding.  Your breasts are  red, tender, or warm.  You have a swollen area on either breast.  You have a fever or chills.  You have nausea or vomiting.  You have drainage other than breast milk from your nipples.  Your breasts do not become full before feedings by the 5th day after you give birth.  You feel sad and depressed.  Your baby is too sleepy to eat well.  Your baby is having trouble sleeping.   Your baby is wetting less than 3 diapers in a 24-hour period.  Your baby has less than 3 stools in a 24-hour period.  Your baby's skin or the white part of his or her eyes becomes yellow.   Your baby is not gaining weight by 48 days of age. SEEK IMMEDIATE MEDICAL CARE IF:   Your baby is overly tired (lethargic) and does not want to wake up and feed.  Your baby develops an unexplained fever. Document Released: 11/14/2005 Document Revised: 07/17/2013 Document Reviewed: 05/08/2013 [facility] Patient Information 2014 [place], [place].

## 2013-11-25 NOTE — Progress Notes (Signed)
GBS seen in urine culture. Amoxicillin prescribed. Will need retreatment in labor.

## 2013-11-28 DIAGNOSIS — K649 Unspecified hemorrhoids: Secondary | ICD-10-CM

## 2013-11-28 HISTORY — DX: Unspecified hemorrhoids: K64.9

## 2013-11-28 LAB — GC/CHLAMYDIA PROBE AMP
Chlamydia trachomatis, NAA: NEGATIVE
Neisseria gonorrhoeae by PCR: NEGATIVE

## 2013-11-28 NOTE — L&D Delivery Note (Signed)
Delivery Note CNM called to the room after at 11:08 PM a non-viable female was delivered via Vaginal, Spontaneous Delivery (Presentation: breech) by RN.  APGAR: 0,0 ; weight: 1lb 3.9oz (563g). Cord clamped and cut by CNM. Large cyst on cord noted. Fetus to anteroom with RN.  Placenta status: Intact, Spontaneous & complete with very gentle cord traction. Sent to  Pathology, to include cultures as well per MFM note earlier today.   Fetus appears macerated and with some anomalies- see autopsy and genetic testing for results.    Anesthesia: Epidural  Episiotomy: None Lacerations: None Est. Blood Loss (mL): 100  Mom to postpartum.  Baby to [place].  Support to pt and family.  Myrtis Ser 04/03/2014, 3:38 AM

## 2013-12-19 ENCOUNTER — Encounter: Payer: Self-pay | Admitting: Obstetrics and Gynecology

## 2013-12-19 ENCOUNTER — Ambulatory Visit (INDEPENDENT_AMBULATORY_CARE_PROVIDER_SITE_OTHER): Payer: 59 | Admitting: Obstetrics and Gynecology

## 2013-12-19 VITALS — BP 127/82 | Wt 175.0 lb

## 2013-12-19 DIAGNOSIS — B951 Streptococcus, group B, as the cause of diseases classified elsewhere: Secondary | ICD-10-CM

## 2013-12-19 DIAGNOSIS — Z3490 Encounter for supervision of normal pregnancy, unspecified, unspecified trimester: Secondary | ICD-10-CM

## 2013-12-19 DIAGNOSIS — Z3480 Encounter for supervision of other normal pregnancy, unspecified trimester: Secondary | ICD-10-CM

## 2013-12-19 DIAGNOSIS — O2390 Unspecified genitourinary tract infection in pregnancy, unspecified trimester: Secondary | ICD-10-CM

## 2013-12-19 DIAGNOSIS — O2341 Unspecified infection of urinary tract in pregnancy, first trimester: Principal | ICD-10-CM

## 2013-12-19 DIAGNOSIS — N390 Urinary tract infection, site not specified: Secondary | ICD-10-CM

## 2013-12-19 NOTE — Progress Notes (Signed)
P=79 

## 2013-12-19 NOTE — Progress Notes (Signed)
Patient is doing very well without any complaints. Patient is scheduled for first trimester screen on 12/31/2013.

## 2013-12-26 ENCOUNTER — Other Ambulatory Visit: Payer: Self-pay | Admitting: Obstetrics and Gynecology

## 2013-12-26 DIAGNOSIS — Z3682 Encounter for antenatal screening for nuchal translucency: Secondary | ICD-10-CM

## 2013-12-31 ENCOUNTER — Ambulatory Visit (HOSPITAL_COMMUNITY)
Admission: RE | Admit: 2013-12-31 | Discharge: 2013-12-31 | Disposition: A | Discharge status: Home / Self Care | Payer: 59 | Source: Ambulatory Visit | Attending: Obstetrics & Gynecology | Admitting: Obstetrics & Gynecology

## 2013-12-31 ENCOUNTER — Encounter (HOSPITAL_COMMUNITY): Payer: Self-pay

## 2013-12-31 ENCOUNTER — Ambulatory Visit (HOSPITAL_COMMUNITY): Admission: RE | Admit: 2013-12-31 | Payer: 59 | Source: Ambulatory Visit

## 2013-12-31 ENCOUNTER — Other Ambulatory Visit: Payer: Self-pay | Admitting: Obstetrics and Gynecology

## 2013-12-31 VITALS — BP 125/73 | HR 90 | Wt 178.0 lb

## 2013-12-31 DIAGNOSIS — B951 Streptococcus, group B, as the cause of diseases classified elsewhere: Secondary | ICD-10-CM

## 2013-12-31 DIAGNOSIS — Z3682 Encounter for antenatal screening for nuchal translucency: Secondary | ICD-10-CM

## 2013-12-31 DIAGNOSIS — Z3689 Encounter for other specified antenatal screening: Secondary | ICD-10-CM | POA: Insufficient documentation

## 2013-12-31 DIAGNOSIS — Z3490 Encounter for supervision of normal pregnancy, unspecified, unspecified trimester: Secondary | ICD-10-CM

## 2013-12-31 DIAGNOSIS — O3510X0 Maternal care for (suspected) chromosomal abnormality in fetus, unspecified, not applicable or unspecified: Secondary | ICD-10-CM | POA: Insufficient documentation

## 2013-12-31 DIAGNOSIS — O351XX0 Maternal care for (suspected) chromosomal abnormality in fetus, not applicable or unspecified: Secondary | ICD-10-CM | POA: Insufficient documentation

## 2013-12-31 DIAGNOSIS — O2341 Unspecified infection of urinary tract in pregnancy, first trimester: Secondary | ICD-10-CM

## 2014-01-01 ENCOUNTER — Other Ambulatory Visit: Payer: Self-pay

## 2014-01-01 ENCOUNTER — Encounter: Payer: Self-pay | Admitting: Obstetrics and Gynecology

## 2014-01-01 DIAGNOSIS — O283 Abnormal ultrasonic finding on antenatal screening of mother: Secondary | ICD-10-CM | POA: Insufficient documentation

## 2014-01-06 ENCOUNTER — Ambulatory Visit (INDEPENDENT_AMBULATORY_CARE_PROVIDER_SITE_OTHER): Payer: 59 | Admitting: Obstetrics & Gynecology

## 2014-01-06 ENCOUNTER — Encounter: Payer: Self-pay | Admitting: Obstetrics & Gynecology

## 2014-01-06 VITALS — BP 116/79 | Wt 175.0 lb

## 2014-01-06 DIAGNOSIS — Z3490 Encounter for supervision of normal pregnancy, unspecified, unspecified trimester: Secondary | ICD-10-CM

## 2014-01-06 DIAGNOSIS — Z3480 Encounter for supervision of other normal pregnancy, unspecified trimester: Secondary | ICD-10-CM

## 2014-01-06 DIAGNOSIS — O26899 Other specified pregnancy related conditions, unspecified trimester: Secondary | ICD-10-CM

## 2014-01-06 DIAGNOSIS — N949 Unspecified condition associated with female genital organs and menstrual cycle: Secondary | ICD-10-CM

## 2014-01-06 DIAGNOSIS — R102 Pelvic and perineal pain: Secondary | ICD-10-CM

## 2014-01-06 DIAGNOSIS — O9989 Other specified diseases and conditions complicating pregnancy, childbirth and the puerperium: Secondary | ICD-10-CM

## 2014-01-06 NOTE — Patient Instructions (Signed)
Return to clinic for any obstetric concerns or go to [facility] for evaluation  

## 2014-01-06 NOTE — Progress Notes (Signed)
Pelvic pressure due to uterine enlargement above pubic symphysis level, pressing on nerves, patient is reassured. Cervix is closed/long/thick Follow up cell-free DNA analysis given megacystitis noted on NT ultrasound No other complaints or concerns.  Routine obstetric precautions reviewed

## 2014-01-06 NOTE — Progress Notes (Signed)
P-102  Patient is having increased pressure and sharp rectal pain that has been happening since Friday.

## 2014-01-16 ENCOUNTER — Ambulatory Visit (INDEPENDENT_AMBULATORY_CARE_PROVIDER_SITE_OTHER): Payer: 59 | Admitting: Obstetrics and Gynecology

## 2014-01-16 ENCOUNTER — Encounter: Payer: Self-pay | Admitting: Obstetrics and Gynecology

## 2014-01-16 VITALS — BP 112/80 | Wt 173.0 lb

## 2014-01-16 DIAGNOSIS — O289 Unspecified abnormal findings on antenatal screening of mother: Secondary | ICD-10-CM

## 2014-01-16 DIAGNOSIS — N390 Urinary tract infection, site not specified: Secondary | ICD-10-CM

## 2014-01-16 DIAGNOSIS — Z3480 Encounter for supervision of other normal pregnancy, unspecified trimester: Secondary | ICD-10-CM

## 2014-01-16 DIAGNOSIS — O2341 Unspecified infection of urinary tract in pregnancy, first trimester: Secondary | ICD-10-CM

## 2014-01-16 DIAGNOSIS — O283 Abnormal ultrasonic finding on antenatal screening of mother: Secondary | ICD-10-CM

## 2014-01-16 DIAGNOSIS — Z3490 Encounter for supervision of normal pregnancy, unspecified, unspecified trimester: Secondary | ICD-10-CM

## 2014-01-16 DIAGNOSIS — B951 Streptococcus, group B, as the cause of diseases classified elsewhere: Secondary | ICD-10-CM

## 2014-01-16 DIAGNOSIS — O2390 Unspecified genitourinary tract infection in pregnancy, unspecified trimester: Secondary | ICD-10-CM

## 2014-01-16 NOTE — Progress Notes (Signed)
P= 86  

## 2014-01-16 NOTE — Progress Notes (Signed)
Patient is doing well without any complaints. Cell free DNA negative but based on [redacted] weeks gestational age. Will contact [facility] to have it recalculated. Follow up 2/24 ultrasound

## 2014-01-17 ENCOUNTER — Other Ambulatory Visit: Payer: Self-pay | Admitting: Obstetrics & Gynecology

## 2014-01-17 DIAGNOSIS — IMO0002 Reserved for concepts with insufficient information to code with codable children: Secondary | ICD-10-CM

## 2014-01-17 DIAGNOSIS — N3289 Other specified disorders of bladder: Secondary | ICD-10-CM

## 2014-01-21 ENCOUNTER — Ambulatory Visit (HOSPITAL_COMMUNITY)
Admission: RE | Admit: 2014-01-21 | Discharge: 2014-01-21 | Disposition: A | Discharge status: Home / Self Care | Payer: 59 | Source: Ambulatory Visit | Attending: Obstetrics & Gynecology | Admitting: Obstetrics & Gynecology

## 2014-01-21 ENCOUNTER — Other Ambulatory Visit: Payer: Self-pay | Admitting: Obstetrics & Gynecology

## 2014-01-21 DIAGNOSIS — Z363 Encounter for antenatal screening for malformations: Secondary | ICD-10-CM | POA: Insufficient documentation

## 2014-01-21 DIAGNOSIS — N3289 Other specified disorders of bladder: Secondary | ICD-10-CM

## 2014-01-21 DIAGNOSIS — IMO0002 Reserved for concepts with insufficient information to code with codable children: Secondary | ICD-10-CM

## 2014-01-21 DIAGNOSIS — O358XX0 Maternal care for other (suspected) fetal abnormality and damage, not applicable or unspecified: Secondary | ICD-10-CM | POA: Insufficient documentation

## 2014-01-21 DIAGNOSIS — Z1389 Encounter for screening for other disorder: Secondary | ICD-10-CM | POA: Insufficient documentation

## 2014-02-03 ENCOUNTER — Encounter: Payer: Self-pay | Admitting: Obstetrics & Gynecology

## 2014-02-05 ENCOUNTER — Telehealth: Payer: Self-pay | Admitting: *Deleted

## 2014-02-05 NOTE — Telephone Encounter (Signed)
Message copied by Erik Obey on Wed Feb 05, 2014  2:48 PM ------      Message from: Guss Bunde      Created: Mon Feb 03, 2014  1:21 PM       Pt needs f/u anatomy scan at 19-20 weeks                    Luna Kitchens the prior message) ------

## 2014-02-05 NOTE — Telephone Encounter (Signed)
Patient already has follow up appointment set up for March 24th.  She will have follow up ob visit with our office on the 16th.

## 2014-02-10 ENCOUNTER — Ambulatory Visit (INDEPENDENT_AMBULATORY_CARE_PROVIDER_SITE_OTHER): Payer: 59 | Admitting: Obstetrics & Gynecology

## 2014-02-10 VITALS — BP 116/84 | Wt 176.0 lb

## 2014-02-10 DIAGNOSIS — Z3480 Encounter for supervision of other normal pregnancy, unspecified trimester: Secondary | ICD-10-CM

## 2014-02-10 DIAGNOSIS — Z3490 Encounter for supervision of normal pregnancy, unspecified, unspecified trimester: Secondary | ICD-10-CM

## 2014-02-10 NOTE — Progress Notes (Signed)
P-95 

## 2014-02-10 NOTE — Progress Notes (Signed)
No problems.  Pt has anatomy scan scheduled.  RTC 4 weeks.

## 2014-02-13 ENCOUNTER — Other Ambulatory Visit: Payer: Self-pay | Admitting: Obstetrics & Gynecology

## 2014-02-13 DIAGNOSIS — O358XX0 Maternal care for other (suspected) fetal abnormality and damage, not applicable or unspecified: Secondary | ICD-10-CM

## 2014-02-18 ENCOUNTER — Ambulatory Visit (HOSPITAL_COMMUNITY)
Admission: RE | Admit: 2014-02-18 | Discharge: 2014-02-18 | Disposition: A | Discharge status: Home / Self Care | Payer: 59 | Source: Ambulatory Visit | Attending: Family Medicine | Admitting: Family Medicine

## 2014-02-18 DIAGNOSIS — O358XX0 Maternal care for other (suspected) fetal abnormality and damage, not applicable or unspecified: Secondary | ICD-10-CM | POA: Insufficient documentation

## 2014-02-19 ENCOUNTER — Encounter: Payer: Self-pay | Admitting: Obstetrics & Gynecology

## 2014-03-06 ENCOUNTER — Other Ambulatory Visit (HOSPITAL_COMMUNITY): Payer: Self-pay | Admitting: Maternal and Fetal Medicine

## 2014-03-06 DIAGNOSIS — O358XX0 Maternal care for other (suspected) fetal abnormality and damage, not applicable or unspecified: Secondary | ICD-10-CM

## 2014-03-11 ENCOUNTER — Ambulatory Visit (INDEPENDENT_AMBULATORY_CARE_PROVIDER_SITE_OTHER): Payer: 59 | Admitting: Obstetrics & Gynecology

## 2014-03-11 ENCOUNTER — Encounter: Payer: Self-pay | Admitting: Obstetrics & Gynecology

## 2014-03-11 VITALS — BP 108/74 | Wt 179.0 lb

## 2014-03-11 DIAGNOSIS — Z3490 Encounter for supervision of normal pregnancy, unspecified, unspecified trimester: Secondary | ICD-10-CM

## 2014-03-11 DIAGNOSIS — Z3480 Encounter for supervision of other normal pregnancy, unspecified trimester: Secondary | ICD-10-CM

## 2014-03-11 NOTE — Progress Notes (Signed)
Routine visit. Good FM. No problems. U/S scheduled for May 6.

## 2014-03-11 NOTE — Progress Notes (Signed)
p-[id number] Plans to use [facility].

## 2014-04-02 ENCOUNTER — Encounter: Payer: Self-pay | Admitting: Obstetrics & Gynecology

## 2014-04-02 ENCOUNTER — Encounter (HOSPITAL_COMMUNITY): Payer: 59 | Admitting: Anesthesiology

## 2014-04-02 ENCOUNTER — Ambulatory Visit (HOSPITAL_COMMUNITY): Admit: 2014-04-02 | Discharge: 2014-04-02 | Disposition: A | Discharge status: Home / Self Care | Payer: 59

## 2014-04-02 ENCOUNTER — Inpatient Hospital Stay (HOSPITAL_COMMUNITY)
Admission: AD | Admit: 2014-04-02 | Discharge: 2014-04-03 | DRG: 775 | Disposition: A | Discharge status: Home / Self Care | Payer: 59 | Source: Ambulatory Visit | Attending: Obstetrics and Gynecology | Admitting: Obstetrics and Gynecology

## 2014-04-02 ENCOUNTER — Other Ambulatory Visit: Payer: Self-pay

## 2014-04-02 ENCOUNTER — Ambulatory Visit (HOSPITAL_COMMUNITY)
Admission: RE | Admit: 2014-04-02 | Discharge: 2014-04-02 | Disposition: A | Discharge status: Home / Self Care | Payer: 59 | Source: Ambulatory Visit | Attending: Family Medicine | Admitting: Family Medicine

## 2014-04-02 ENCOUNTER — Other Ambulatory Visit (HOSPITAL_COMMUNITY): Payer: Self-pay | Admitting: Maternal and Fetal Medicine

## 2014-04-02 ENCOUNTER — Ambulatory Visit (HOSPITAL_COMMUNITY)
Admission: RE | Admit: 2014-04-02 | Discharge: 2014-04-02 | Disposition: A | Discharge status: Home / Self Care | Payer: 59 | Source: Ambulatory Visit | Attending: Obstetrics & Gynecology | Admitting: Obstetrics & Gynecology

## 2014-04-02 ENCOUNTER — Encounter (HOSPITAL_COMMUNITY): Payer: Self-pay | Admitting: *Deleted

## 2014-04-02 ENCOUNTER — Inpatient Hospital Stay (HOSPITAL_COMMUNITY): Payer: 59 | Admitting: Anesthesiology

## 2014-04-02 VITALS — BP 118/77 | HR 98 | Wt 180.0 lb

## 2014-04-02 DIAGNOSIS — O358XX0 Maternal care for other (suspected) fetal abnormality and damage, not applicable or unspecified: Secondary | ICD-10-CM

## 2014-04-02 DIAGNOSIS — Z3490 Encounter for supervision of normal pregnancy, unspecified, unspecified trimester: Secondary | ICD-10-CM

## 2014-04-02 DIAGNOSIS — IMO0002 Reserved for concepts with insufficient information to code with codable children: Secondary | ICD-10-CM | POA: Insufficient documentation

## 2014-04-02 DIAGNOSIS — O99892 Other specified diseases and conditions complicating childbirth: Secondary | ICD-10-CM

## 2014-04-02 DIAGNOSIS — O2341 Unspecified infection of urinary tract in pregnancy, first trimester: Secondary | ICD-10-CM

## 2014-04-02 DIAGNOSIS — Z22330 Carrier of Group B streptococcus: Secondary | ICD-10-CM

## 2014-04-02 DIAGNOSIS — B951 Streptococcus, group B, as the cause of diseases classified elsewhere: Secondary | ICD-10-CM

## 2014-04-02 DIAGNOSIS — O289 Unspecified abnormal findings on antenatal screening of mother: Secondary | ICD-10-CM | POA: Insufficient documentation

## 2014-04-02 DIAGNOSIS — O9989 Other specified diseases and conditions complicating pregnancy, childbirth and the puerperium: Secondary | ICD-10-CM

## 2014-04-02 DIAGNOSIS — O364XX0 Maternal care for intrauterine death, not applicable or unspecified: Secondary | ICD-10-CM

## 2014-04-02 DIAGNOSIS — O283 Abnormal ultrasonic finding on antenatal screening of mother: Secondary | ICD-10-CM

## 2014-04-02 LAB — CBC
HEMATOCRIT: 36.2 % (ref 36.0–46.0)
Hemoglobin: 12.5 g/dL (ref 12.0–15.0)
MCH: 29.4 pg (ref 26.0–34.0)
MCHC: 34.5 g/dL (ref 30.0–36.0)
MCV: 85.2 fL (ref 78.0–100.0)
Platelets: 290 10*3/uL (ref 150–400)
RBC: 4.25 MIL/uL (ref 3.87–5.11)
RDW: 13.9 % (ref 11.5–15.5)
WBC: 10.8 10*3/uL — ABNORMAL HIGH (ref 4.0–10.5)

## 2014-04-02 LAB — MISCELLANEOUS TEST

## 2014-04-02 LAB — RPR

## 2014-04-02 MED ORDER — FENTANYL 2.5 MCG/ML BUPIVACAINE 1/10 % EPIDURAL INFUSION (WH - ANES)
INTRAMUSCULAR | Status: DC | PRN
  Administered 2014-04-02: 14 mL/h via EPIDURAL

## 2014-04-02 MED ORDER — FENTANYL 2.5 MCG/ML BUPIVACAINE 1/10 % EPIDURAL INFUSION (WH - ANES)
14.0000 mL/h | INTRAMUSCULAR | Status: DC | PRN
  Filled 2014-04-02: qty 125

## 2014-04-02 MED ORDER — LACTATED RINGERS IV SOLN
500.0000 mL | Freq: Once | INTRAVENOUS | Status: DC
Start: 2014-04-02 — End: 2014-04-03
  Administered 2014-04-02: 500 mL via INTRAVENOUS

## 2014-04-02 MED ORDER — PHENYLEPHRINE 40 MCG/ML (10ML) SYRINGE FOR IV PUSH (FOR BLOOD PRESSURE SUPPORT): 80.0000 ug | PREFILLED_SYRINGE | INTRAVENOUS | Status: DC | PRN

## 2014-04-02 MED ORDER — LACTATED RINGERS IV SOLN
INTRAVENOUS | Status: DC
  Administered 2014-04-02: 75 mL/h via INTRAVENOUS

## 2014-04-02 MED ORDER — PROMETHAZINE HCL 25 MG/ML IJ SOLN: 25.0000 mg | Freq: Once | INTRAMUSCULAR | Status: DC

## 2014-04-02 MED ORDER — MORPHINE SULFATE 10 MG/ML IJ SOLN
10.0000 mg | Freq: Once | INTRAMUSCULAR | Status: DC
Start: 2014-04-02 — End: 2014-04-02

## 2014-04-02 MED ORDER — OXYTOCIN BOLUS FROM INFUSION
500.0000 mL | INTRAVENOUS | Status: DC
  Administered 2014-04-02: 500 mL via INTRAVENOUS

## 2014-04-02 MED ORDER — EPHEDRINE 5 MG/ML INJ
10.0000 mg | INTRAVENOUS | Status: DC | PRN
  Filled 2014-04-02: qty 4

## 2014-04-02 MED ORDER — LIDOCAINE HCL (PF) 1 % IJ SOLN
INTRAMUSCULAR | Status: DC | PRN
  Administered 2014-04-02: 5 mL
  Administered 2014-04-02: 3 mL
  Administered 2014-04-02: 5 mL

## 2014-04-02 MED ORDER — DIPHENHYDRAMINE HCL 50 MG/ML IJ SOLN: 12.5000 mg | INTRAMUSCULAR | Status: DC | PRN

## 2014-04-02 MED ORDER — IBUPROFEN 600 MG PO TABS: 600.0000 mg | ORAL_TABLET | Freq: Four times a day (QID) | ORAL | Status: DC | PRN

## 2014-04-02 MED ORDER — ACETAMINOPHEN 325 MG PO TABS: 650.0000 mg | ORAL_TABLET | ORAL | Status: DC | PRN

## 2014-04-02 MED ORDER — OXYTOCIN 40 UNITS IN LACTATED RINGERS INFUSION - SIMPLE MED
62.5000 mL/h | INTRAVENOUS | Status: DC
  Filled 2014-04-02: qty 1000

## 2014-04-02 MED ORDER — LACTATED RINGERS IV SOLN: 500.0000 mL | INTRAVENOUS | Status: DC | PRN

## 2014-04-02 MED ORDER — PHENYLEPHRINE 40 MCG/ML (10ML) SYRINGE FOR IV PUSH (FOR BLOOD PRESSURE SUPPORT)
80.0000 ug | PREFILLED_SYRINGE | INTRAVENOUS | Status: DC | PRN
  Filled 2014-04-02: qty 10

## 2014-04-02 MED ORDER — CITRIC ACID-SODIUM CITRATE 334-500 MG/5ML PO SOLN: 30.0000 mL | ORAL | Status: DC | PRN

## 2014-04-02 MED ORDER — MISOPROSTOL 200 MCG PO TABS
200.0000 ug | ORAL_TABLET | ORAL | Status: DC | PRN
  Administered 2014-04-02 (×3): 200 ug via VAGINAL
  Filled 2014-04-02 (×3): qty 1

## 2014-04-02 MED ORDER — ONDANSETRON HCL 4 MG/2ML IJ SOLN: 4.0000 mg | Freq: Four times a day (QID) | INTRAMUSCULAR | Status: DC | PRN

## 2014-04-02 MED ORDER — LIDOCAINE HCL (PF) 1 % IJ SOLN: 30.0000 mL | INTRAMUSCULAR | Status: DC | PRN

## 2014-04-02 MED ORDER — OXYCODONE-ACETAMINOPHEN 5-325 MG PO TABS: 1.0000 | ORAL_TABLET | ORAL | Status: DC | PRN

## 2014-04-02 MED ORDER — EPHEDRINE 5 MG/ML INJ: 10.0000 mg | INTRAVENOUS | Status: DC | PRN

## 2014-04-02 MED ORDER — NALBUPHINE HCL 10 MG/ML IJ SOLN
20.0000 mg | INTRAMUSCULAR | Status: DC | PRN
  Administered 2014-04-02 (×2): 20 mg via INTRAVENOUS
  Filled 2014-04-02 (×2): qty 2

## 2014-04-02 MED ORDER — TERBUTALINE SULFATE 1 MG/ML IJ SOLN: 0.2500 mg | Freq: Once | INTRAMUSCULAR | Status: AC | PRN

## 2014-04-02 NOTE — Consult Note (Addendum)
MFM Staff Consultation  Impressions:  SIUP at 19+3 weeks No cardiac activity --> consistent with IUFD EFW <10th%  Calvarium is irregular in shape  Scalp edema Bowel appears echogenic Thickened nuchal fold bladder somewhat elongated in AP diameter with narrow shape/contour umbilical cord cyst;  low risk NIPS Normal amniotic fluid volume   Discussion:  I discussed with the patient the fact that stillbirths defined as in utero fetal death after [redacted] weeks gestation is an important and unfortunately still largely unstudied problem in the [place]. Over 26,000 stillbirths are reported annually. Although several conditions are linked to stillbirth, the precise etiology is difficult to define and the science surrounding recommended evaluation strategies suffers from sporadic and incomplete data. In many cases, it is difficult to be certain of the etiology of stillbirths. Even with very complete evaluation algorithms, 10% to 75% of stillbirths remain unexplained.   Twenty five to thirty five percent of stillborn infants undergoing autopsy have intrinsic structural abnormalities but only 25% of those fetuses will have an abnormal karyotype leaving 75% who clearly have some type of genetic problem that is not explained by aneuploidy. In developed countries, 10% to 25% of stillbirths may be caused by a maternal or fetal infection. The incidence of infectious etiology of stillbirth is even higher in developing countries. In developed countries, ascending bacterial infections prior to and after membrane rupture with organisms like E. coli, Group B Streptococcus or ureaplasma are the most common infectious causes of stillbirth.  However, there is also an association of stillbirth with infection with Listeria monocytogenes which requires special culture conditions. A proportion of infectious stillbirths may be due to viral infections; eg, Parvovirus B19 and CMV clearly have an association.   I  recommended amniocentesis for microarray/karyotyping and infectious etiology screening (Toxoplasmosis, CMV, and Parvovirus).  I cited Wapner et al and [employer] al with respect to amniocentesis versus biopsy of fetal fascia postdelivery stating that amniocentesis has a better chance for yielding cells to karyotype and perform microarray.   I discussed the risks and benefits of the procedure and the patient gave informed consent. Amniocentesis does carry some risk even in setting of IUFD; eg, maternal injury/infection of less than 1% but not 0%. After discussing the risks, benefits, and limitations of genetic testing as well as reviewing the ultrasound findings, the patient decided to have an amniocentesis.   After genetic counseling and MFM consultation following her ultrasound the patient accepted and desired a complete workup for infection, genetic testing, desires autopsy, placental pathology (replete with cultures) and phlebotomy for assessment of antiphosphilipid antibody panel. A positive finding would result in the need perform a second panel for APS to document 2 positives 12 weeks apart by [place] criteria for diagnosis of APS syndrome.   Recommendations:  1. amniocentesis for karyotype/microarray and CMV, Toxoplasmosis, and Parvovirus B 19 was sent; 2. given concern for IUFD remote from today's encounter, consider drawing CMV, Toxoplasmosis, RPR, and Parvovirus titers by phlebotomy 3. patient desires autopsy 4. patient declined chaplain 5. see genetic counseling consult 6. recommend antiphospholipid antibody panel (lupus anticoagulant, anticardiolipin IgG/IgM, and antibeta2glycoprotein I IgG/IgM) 7. recommend sending placenta for pathology evaluation replete with cultures of the amnion/chorion at time of delivery 8. patient desires induction of labor and was sent to L&D. 9. Recommend follow up consultation to review autopsy and all records postpartum when results are available, noting this will  assist in generating thoughtful approach to intergestational/preconceptional counseling for future pregnancies (eg, recurrence risk, utility of low dose  aspirin depending upon results, etc).   Time Spent: I spent in excess of 60 minutes in consultation with this patient to review records, evaluate her case, and provide her with an adequate discussion and education.  More than 50% of this time was spent in direct face-to-face counseling. It was a pleasure seeing your patient in the office today.  Thank you for consultation. Please do not hesitate to contact our service for any further questions.   Thank you,  Delman Cheadle Harl Favor, Delman Cheadle, MD, MS, FACOG Assistant Professor Section of [place]

## 2014-04-02 NOTE — Progress Notes (Signed)
Chaplain notified of admission for IUFD and request for advanced directive material

## 2014-04-02 NOTE — Progress Notes (Signed)
Melissa Shah is a 28 y.o. G1P0 at [redacted]w[redacted]d   Subjective: Pt rec'd Nubain recently- still with some back discomfort radiating to low abd; cytotec dosing x 2  Objective: BP 120/66  Pulse 92  Temp(Src) 98.2 F (36.8 C) (Oral)  Resp 16  Ht 5\' 2"  (1.575 m)  Wt 81.647 kg (180 lb)  BMI 32.91 kg/m2  SpO2 100%  LMP 09/10/2013      FHT: N/A UC:   irregular, every 3-5 minutes SVE:  Closed from prev exam Labs: Lab Results  Component Value Date   WBC 10.8* 04/02/2014   HGB 12.5 04/02/2014   HCT 36.2 04/02/2014   MCV 85.2 04/02/2014   PLT 290 04/02/2014    Assessment / Plan: IUFD Unfavorable cx  Plan MSO4/Phen if Nubain doesn't relieve pain Will get epidural as needed  Myrtis Ser 04/02/2014, 11:27 PM

## 2014-04-02 NOTE — Progress Notes (Signed)
I spent time on two separate visits with Melissa Shah, as well as her boyfriend, Jonathan's mother, Juliann Pulse.  [place] is looking for the positive in this situation and that is helping her cope as she goes through this initial phase of shock.  She is afraid of what the baby might look like and how she will handle it.  She has good support from family and friends and I gave her my card for follow up support as well.    I will alert Chaplain Loann Quill who is on-call tonight, please page him as needs arise, [phone].  Chaplain Janne Napoleon 4:02 PM   04/02/14 1500  Clinical Encounter Type  Visited With Patient;Patient and family together  Visit Type Spiritual support  Referral From Nurse  Spiritual Encounters  Spiritual Needs Emotional;Grief support  Stress Factors  Patient Stress Factors Loss

## 2014-04-02 NOTE — Progress Notes (Signed)
Melissa Shah is a 28 y.o. G1P0 at [redacted]w[redacted]d   Subjective: Mostly comfortable with epidural  Objective: BP 120/66  Pulse 92  Temp(Src) 98.2 F (36.8 C) (Oral)  Resp 16  Ht 5\' 2"  (1.575 m)  Wt 81.647 kg (180 lb)  BMI 32.91 kg/m2  SpO2 100%  LMP 09/10/2013      FHT: N/A UC:   irregular, every 3 minutes SVE:   Dilation: 2.5 Effacement (%): 100 Station: -2;-3 Exam by:: K.Rolonda Pontarelli, CNM- AR[facility] for brownish fluid  Labs: Lab Results  Component Value Date   WBC 10.8* 04/02/2014   HGB 12.5 04/02/2014   HCT 36.2 04/02/2014   MCV 85.2 04/02/2014   PLT 290 04/02/2014    Assessment / Plan: IUFD Early labor  Plan to hold off on next dosing of cytotec due to favorability of cx Anticipate ROM to encourage ctx/labor  Myrtis Ser 04/02/2014, 11:30 PM

## 2014-04-02 NOTE — Anesthesia Procedure Notes (Signed)
Epidural Patient location during procedure: OB  Staffing Anesthesiologist: Montez Hageman Performed by: anesthesiologist   Preanesthetic Checklist Completed: patient identified, site marked, surgical consent, pre-op evaluation, timeout performed, IV checked, risks and benefits discussed and monitors and equipment checked  Epidural Patient position: sitting Prep: ChloraPrep Patient monitoring: heart rate, continuous pulse ox and blood pressure Approach: right paramedian Location: L2-L3 Injection technique: LOR saline  Needle:  Needle type: Tuohy  Needle gauge: 17 G Needle length: 9 cm and 9 Needle insertion depth: 6 cm Catheter type: closed end flexible Catheter size: 20 Guage Catheter at skin depth: 9 cm Test dose: negative  Assessment Events: blood not aspirated, injection not painful, no injection resistance, negative IV test and no paresthesia  Additional Notes   Patient tolerated the insertion well without complications.

## 2014-04-02 NOTE — H&P (Signed)
Ellaina Schuler is a 28 y.o. female G1P0 with IUP at [redacted]w[redacted]d presenting for IOL for IUFD at 25wk. Pt in MFM for [place] and found to have absent cardiac activity and size < dates. Amniocentesis was performed and found to have dark fluid. Pt currently is coming to grasp with the situation and FOB and GM are in the room for support. Pt and support's questions answered by MFM prior to arrival and myself.  [facility] 7 wks  Prenatal History/Complications: Uncomplicated prenatal course megacytistis - cell free DNA low risk.  Past Medical History: Past Medical History  Diagnosis Date  . S/P ACL surgery 2013    complete replacement cadaver acl  . Fibrocystic breast 2011    2011-2013 monitored    Past Surgical History: Past Surgical History  Procedure Laterality Date  . Cervical polyp removal  2012    Obstetrical History: OB History   Grav Para Term Preterm Abortions TAB SAB Ect Mult Living   1               Gynecological History: OB History   Grav Para Term Preterm Abortions TAB SAB Ect Mult Living   1               Social History: History   Social History  . Marital Status: Single    Spouse Name: N/A    Number of Children: N/A  . Years of Education: N/A   Social History Main Topics  . Smoking status: Never Smoker   . Smokeless tobacco: Never Used  . Alcohol Use: Yes     Comment: rare  . Drug Use: No  . Sexual Activity: Yes   Other Topics Concern  . Not on file   Social History Narrative  . No narrative on file    Family History: Family History  Problem Relation Age of Onset  . Fibroids Mother   . Fibrocystic breast disease Mother   . Diabetes Father   . Hypertension Maternal Grandmother     Allergies: No Known Allergies  Prescriptions prior to admission  Medication Sig Dispense Refill  . Prenat-FeBis-FePro-FA-CA-Omega (COMPLETE NATAL DHA) 29-1-200 & 250 MG MISC          Review of Systems   Constitutional: No complaints at this time. No VB, no  Ctx, no LOF  Height 5\' 2"  (1.575 m), weight 81.647 kg (180 lb), last menstrual period 09/10/2013. General appearance: alert, cooperative, appears stated age and no distress Lungs: clear to auscultation bilaterally Heart: regular rate and rhythm Abdomen: soft, non-tender; bowel sounds normal Dilation: Closed Effacement (%): Thick Exam by:: Dr. Leslie Andrea  Extremities: Bevelyn Buckles sign is negative, no sign of DVT  Presentation: breech Uterine activityNone Dilation: Closed Effacement (%): Thick Exam by:: Dr. Leslie Andrea   Prenatal labs: ABO, Rh: O/--/-- (12/22 1344) Antibody: Negative (12/22 1344) Rubella:   Immune RPR:   NR HBsAg: Negative (12/22 1344)  HIV:   NR GBS:   Positive 1 hr Glucola not performed Genetic screening  Neg Anatomy US Megacystitis   Prenatal Transfer Tool  Maternal Diabetes: Unknown Genetic Screening: Normal Maternal Ultrasounds/Referrals: Megacystitis and IUFD Fetal Ultrasounds or other Referrals:  None Maternal Substance Abuse:  No Significant Maternal Medications:  None Significant Maternal Lab Results: Lab values include: Group B Strep positive     Results for orders placed during the hospital encounter of 04/02/14 (from the past 24 hour(s))  MISCELLANEOUS TEST   Collection Time    04/02/14 11:19 AM  Result Value Ref Range   Miscellaneous Test TESTS SENT TO [facility]     Miscellaneous Test Results Specimen sent to [facility] for testing      Assessment: Netasha Wehrli is a 28 y.o. G1P0 at [redacted]w[redacted]d by R=7 here for IOL for IUFD #Labor: closed cervix. Will start with cytotec 22mcg PV q4hr, breech will proceed with vaginal delivery #Pain: IV nubain 20mg  q3hr PRN  #FWB: IUFD/Breech, at this time MOC does not want to see fetus after delivery. #ID:  No evidence of infection at [facility] stime  MFM recommendations 1. amniocentesis for karyotype/microarray and CMV, Toxoplasmosis, and Parvovirus B 19 was sent;  2. given concern for IUFD remote from today's  encounter, consider drawing CMV, Toxoplasmosis, RPR, and Parvovirus titers by phlebotomy  3. patient desires autopsy  4. patient declined chaplain  5. see genetic counseling consult  6. recommend antiphospholipid antibody panel (lupus anticoagulant, anticardiolipin IgG/IgM, and antibeta2glycoprotein I IgG/IgM)  7. recommend sending placenta for pathology evaluation replete with cultures of the amnion/chorion at time of delivery  8. patient desires induction of labor and was sent to L&D.  9. Recommend follow up consultation to review autopsy and all records postpartum when results are available, noting this will assist in generating thoughtful approach to intergestational/preconceptional counseling for future pregnancies (eg, recurrence risk, utility of low dose aspirin depending upon results, etc).    Allen Norris 04/02/2014, 12:41 PM

## 2014-04-02 NOTE — Anesthesia Preprocedure Evaluation (Signed)

## 2014-04-02 NOTE — Progress Notes (Signed)
Pt arrived in room.  Pt, husband and mother crying.  RN instructed pt that she would give them another ten minutes together prior to admission process

## 2014-04-02 NOTE — Progress Notes (Signed)
Melissa Shah is a 28 y.o. G1P0 at [redacted]w[redacted]d admitted for induction of labor due to IUFD.  Subjective: Patient mostly comfortable, starting to feel increased pelvic pressure and pain.  Objective: BP 121/72  Pulse 85  Temp(Src) 98.2 F (36.8 C) (Oral)  Resp 16  Ht 5\' 2"  (1.575 m)  Wt 81.647 kg (180 lb)  BMI 32.91 kg/m2  LMP 09/10/2013      FHT:  None - d/t IUFD UC:   Irregular UCs, uncomfortable, some difficulty tracing on toco, earlier with q 2-4 min. SVE:   Dilation: Closed Effacement (%): Thick Exam by:: Dr. Leslie Andrea (prior exam. No SVE at this time)  Labs: Lab Results  Component Value Date   WBC 10.8* 04/02/2014   HGB 12.5 04/02/2014   HCT 36.2 04/02/2014   MCV 85.2 04/02/2014   PLT 290 04/02/2014    Assessment / Plan: Induction of labor due to fetal demise.  Labor: IOL s/p Cytotec 200mg  PV x 1 dose. Continue to repeat Cytotec 200mg  q 4 hr. Preeclampsia:  n/a Fetal Wellbeing:  n/a - fetal demise Pain Control:  Nubain IV I/D:  n/a Anticipated MOD:  NSVD  Nobie Putnam, DO [place], PGY-1  04/02/2014, 5:23 PM

## 2014-04-02 NOTE — Progress Notes (Signed)
Genetic Counseling  High-Risk Gestation Note  Appointment Date:  04/02/2014 Referred By: Osborne Oman, MD Date of Birth:  [birth year] Partner:  Jesse Fall    Pregnancy History: G1P0 Estimated Date of Delivery: 07/12/14 Estimated Gestational Age: 67w4dAttending: DViann Fish MD   I met with Ms. AJanese Banksand her partner, Mr. JJesse Fall for genetic counseling regarding abnormal ultrasound findings and an intrauterine fetal demise.  Ms. GFeinbergpreviously had [facility]  Her first ultrasound on January 02, 2014, revealed an enlarged fetal bladder, consistent with fetal megacystis.  In addition, there were two umbilical cord cysts found at that time.  Follow-up ultrasound 3 weeks later showed that the fetal bladder appeared normal in size.  The umbilical cord cysts were again visualized.  The remainder of the anatomy appeared normal at that time.  Ultrasound 4 weeks later showed that the fetal bladder appeared elongated.  One umbilical cord cyst was visualized at that time.  The cyst appeared larger than during the previous ultrasound.  Ms. GSkiverelected to have noninvasive prenatal screening following her first ultrasound at ~[redacted] weeks gestation.  She requested to have the [employer] test (she is a LArmed forces logistics/support/administrative officer.  The results were low risk for fetal Down syndrome, trisomy 153 and trisomy 17  Ms. GGreifreturned today for a growth ultrasound and sadly, there was an intrauterine fetal demise.   I met with this couple to review the previous ultrasound findings, possible causes for these findings, and available testing options.  They were counseled that megacystis is defined as abnormal enlargement of the fetal bladder. This finding is occasionally detected prior to [redacted] weeks gestation, often during ultrasound to assess the fetal nuchal translucency, as was the case for Ms. Wideman.  During the first trimester of  pregnancy, a diameter of the fetal bladder greater than 7 mm has been suggested to establish the diagnosis of megacystis.  They were counseled that megacystis is often classified according to severity, with greater than 15 mm considered to be the most severe end of the spectrum.  Ultrasound in our office showed a bladder diameter of ~7 mm, consistent with mild megacystis. This later appeared to resolve, however, the shape of the bladder appeared abnormal.  We discussed that in many cases, the underlying etiology for megacystis is unknown. Urethral atresia appears to be the most common pathological condition associated with megacystis of the first trimester.  Approximately 50% of cases of first trimester megacystis will spontaneously resolve.  We discussed other etiologies including: posterior urethral valves, megacystis microcolon intestinal hypoperistalsis syndrome, and megacystis megaureter syndrome.  We discussed that these conditions can be isolated and nonsyndromic or part of an underlying genetic condition.  By ultrasound, none of these specific etiologies appear to be clearly the cause of Ms. Murray's fetal megacystis.  They were counseled that the finding of megacystis is associated with an approximate 20-25% risk for fetal aneuploidy.    We reviewed chromosomes, nondisjunction, and the normal NIPS result.  We discussed that while highly sensitive and specific, NIPS is not diagnostic for fetal aneuploidy.  In addition, we discussed that other chromosome aberrations (unbalanced translocations and deletions) have also been found to be associated with fetal megacystis.  Given the risk for an underlying chromosome aberration, we also discussed the availability of microarray analysis.   They were counseled that microarray analysis is a molecular based technique in which a test sample of DNA (fetal) is  compared to a reference (normal) genome in order to determine if the test sample has any extra or missing  genetic information.  Microarray analysis allows for the detection of genetic deletions and duplications that are 872 times smaller than those identified by routine chromosome analysis.  We discussed that recent publications show that approximately 6% of patients with an abnormal fetal ultrasound and a normal fetal karyotype had a significant microdeletion/microduplication detected by prenatal microarray analysis.  We also discussed that chromosome and microarray analysis can be performed on products of conception following delivery of the stillborn infant.  Given that the demise likely occurred greater than a week ago, based on fetal measurements, we discussed the increased likelihood that the sample may not yield a results.  After thoughtful consideration of these options, this couple elected to have amniocentesis.  Ms. Wiley again requested that her amniotic fluid sample be sent to [facility].  Chromosome analysis was requested with reflex to microarray analysis if the karyotype is normal.  In addition, we discussed the finding of an umbilical cord cyst(s).  They were counseled that when isolated, these are usually of no consequences, but that they can be associated with both fetal and other cord abnormalities.  These usually arise sporadically and are not highly associated with a risk for fetal aneuploidy unless other markers or anomalies are detected.  This couple was counseled that the risk of recurrence depends on the underlying etiology for the fetal differences.  We briefly reviewed single gene disorders and common inheritance patterns, including autosomal recessive inheritance.    Dr. Margurite Auerbach discussed environmental etiologies for the demise including infectious etiologies.  Amniotic fluid was also sent for infectious studies (CMV, toxoplasmosis, and parvo).    A detailed family history was noncontributory for genetic conditions and birth defects.  However, Ms. Horacek reported that she has two paternal  first cousins who had intellectual disability of unknown etiology.  We briefly discussed that there are many different causes of intellectual disabilities including environmental, multifactorial, and genetic etiologies.  We discussed that a specific diagnosis for intellectual disability can be determined in approximately 50% of these individuals.  In the remaining 50% of individuals, a diagnosis may never be determined.  Regarding genetic causes, we discussed that chromosome aberrations (aneuploidy, deletions, duplications, insertions, and translocations) are responsible for a small percentage of individuals with intellectual disability.  Likewise, single gene conditions are the underlying cause of intellectual delay in some families.  Considering this family history, Ms. Smethers should be offered FMRI analysis for fragile X syndrome during a future pregnancy.  This was not discussed in detail today given the nature of the visit.  Further genetic counseling is warranted if more information is obtained.  The majority of time (>50%) was spent on counseling with this patient and/or her family member.  The approximate face-to-face time with the genetic counselor was 49 minutes.  Sharyne Richters, MS Certified Genetic Counselor

## 2014-04-03 ENCOUNTER — Encounter (HOSPITAL_COMMUNITY): Payer: Self-pay | Admitting: *Deleted

## 2014-04-03 DIAGNOSIS — O364XX0 Maternal care for intrauterine death, not applicable or unspecified: Secondary | ICD-10-CM | POA: Diagnosis not present

## 2014-04-03 LAB — CARDIOLIPIN ANTIBODIES, IGM+IGG
ANTICARDIOLIPIN IGG: 5 GPL U/mL — AB (ref <23)
ANTICARDIOLIPIN IGM: 9 [MPL'U]/mL — AB (ref <11)

## 2014-04-03 LAB — BETA-2-GLYCOPROTEIN I ABS, IGG/M/A
BETA 2 GLYCO I IGG: 9 G Units (ref <20)
BETA-2-GLYCOPROTEIN I IGA: 9 A Units (ref <20)
BETA-2-GLYCOPROTEIN I IGM: 10 M Units (ref <20)

## 2014-04-03 LAB — LUPUS ANTICOAGULANT PANEL
DRVVT: 36 secs (ref <42.9)
Lupus Anticoagulant: NOT DETECTED
PTT LA: 34.6 s (ref 28.0–43.0)

## 2014-04-03 LAB — CHROMOSOME STD, POC(TISSUE)-NCBH

## 2014-04-03 MED ORDER — WITCH HAZEL-GLYCERIN EX PADS: 1.0000 "application " | MEDICATED_PAD | CUTANEOUS | Status: DC | PRN

## 2014-04-03 MED ORDER — BENZOCAINE-MENTHOL 20-0.5 % EX AERO
1.0000 "application " | INHALATION_SPRAY | CUTANEOUS | Status: DC | PRN
  Filled 2014-04-03: qty 56

## 2014-04-03 MED ORDER — OXYCODONE-ACETAMINOPHEN 5-325 MG PO TABS: 1.0000 | ORAL_TABLET | ORAL | Status: DC | PRN

## 2014-04-03 MED ORDER — DIBUCAINE 1 % RE OINT
1.0000 "application " | TOPICAL_OINTMENT | RECTAL | Status: DC | PRN
  Filled 2014-04-03: qty 28

## 2014-04-03 MED ORDER — LANOLIN HYDROUS EX OINT: TOPICAL_OINTMENT | CUTANEOUS | Status: DC | PRN

## 2014-04-03 MED ORDER — PRENATAL MULTIVITAMIN CH
1.0000 | ORAL_TABLET | Freq: Every day | ORAL | Status: DC
  Administered 2014-04-03: 1 via ORAL
  Filled 2014-04-03: qty 1

## 2014-04-03 MED ORDER — SENNOSIDES-DOCUSATE SODIUM 8.6-50 MG PO TABS: 2.0000 | ORAL_TABLET | ORAL | Status: DC

## 2014-04-03 MED ORDER — ONDANSETRON HCL 4 MG PO TABS: 4.0000 mg | ORAL_TABLET | ORAL | Status: DC | PRN

## 2014-04-03 MED ORDER — IBUPROFEN 600 MG PO TABS: 600.0000 mg | ORAL_TABLET | Freq: Four times a day (QID) | ORAL | Status: DC

## 2014-04-03 MED ORDER — ONDANSETRON HCL 4 MG/2ML IJ SOLN: 4.0000 mg | INTRAMUSCULAR | Status: DC | PRN

## 2014-04-03 MED ORDER — ZOLPIDEM TARTRATE 5 MG PO TABS: 5.0000 mg | ORAL_TABLET | Freq: Every evening | ORAL | Status: DC | PRN

## 2014-04-03 MED ORDER — DIPHENHYDRAMINE HCL 25 MG PO CAPS: 25.0000 mg | ORAL_CAPSULE | Freq: Four times a day (QID) | ORAL | Status: DC | PRN

## 2014-04-03 MED ORDER — SIMETHICONE 80 MG PO CHEW: 80.0000 mg | CHEWABLE_TABLET | ORAL | Status: DC | PRN

## 2014-04-03 MED ORDER — TETANUS-DIPHTH-ACELL PERTUSSIS 5-2.5-18.5 LF-MCG/0.5 IM SUSP
0.5000 mL | Freq: Once | INTRAMUSCULAR | Status: DC
  Filled 2014-04-03 (×2): qty 0.5

## 2014-04-03 MED ORDER — IBUPROFEN 600 MG PO TABS
600.0000 mg | ORAL_TABLET | Freq: Four times a day (QID) | ORAL | Status: DC
  Administered 2014-04-03 (×2): 600 mg via ORAL
  Filled 2014-04-03 (×2): qty 1

## 2014-04-03 NOTE — Discharge Instructions (Signed)
Intrauterine Fetal Demise About one percent of normal, uncomplicated pregnancies end in fetal death (intrauterine fetal demise, IUFD). It is considered a fetal death when it occurs after the 20th week of pregnancy. It is considered a miscarriage when a fetal death occurs in the first 20 weeks. The mother's health is usually not in danger. Usually, there is nothing that can be done to prevent it. CAUSES  Often the cause is unknown.  Examination of the stillborn fetus after delivery may show an abnormality in the umbilical cord. An exam my also show a problem with the placenta or fetus. These problems may include infections or a variety of birth defects and genetic disorders.  The pregnancy continues for 42 weeks or later (post term pregnancy).  Conditions in the mother such as diabetes, high blood pressure, and numerous other medical, physical or poor lifestyle choices (illegal drugs, alcohol, smoking) increase the risk for fetal death. Often, however, risk factors are unknown.  Multiple pregnancies (twins or more) increase the risk of fetal death.  HOME CARE INSTRUCTIONS   Restrictions are usually not necessary unless associated with the delivery choice.  Sexual intercourse should be avoided for 4 to 6 weeks. Starting another pregnancy should be delayed several months, or as suggested by your caregiver.  Do not use tampons or douche.  Only take over-the-counter or prescription medicines for pain, discomfort, or fever as directed by your caregiver. Do not take aspirin it can cause you to bleed. Call your caregiver for a prescription for stronger pain medication if you need it.  No special diet is necessary unless you have diabetes or other medical problems that require a special diet.  Take showers instead of baths until your caregiver tells you it is okay.  Ask your caregiver when you can return to driving and to your everyday activities.  Make an appointment with your caregiver for  follow up care. PREVENTION   Eliminate any of the causes, if possible, that were found after evaluating the fetus.  Control any medical problems you may have before or during the pregnancy.  Avoid illegal drugs, alcohol and smoking.  Maintain good prenatal care and follow your caregiver's treatment and advice.  Report any concerns or unusual changes you notice during your pregnancy.  More frequent prenatal visits may be necessary with the next child. SEEK MEDICAL CARE IF:   You develop abnormal vaginal discharge.  You develop a temperature 102 F (38.9 C) or higher.  You are getting dizzy and faint.  You are feeling depressed. SEEK IMMEDIATE MEDICAL CARE IF:  During pregnancy:  You fail to gain weight, or your abdomen is not increasing in size.  Your unborn child appears to have less movement or stopped moving. Keep your medical conditions under control. After delivery:  You have heavy vaginal bleeding.  You have chills and fever.  You have chest pain.  You have shortness of breath.  You have pain or swelling or redness of your leg.  Following the death of a fetus, you or a family member need help or emotional support in coping with the grief process. MAKE SURE YOU:   Understand these instructions.  Will watch your condition.  Will get help right away if you are not doing well or get worse. Document Released: 11/14/2005 Document Revised: 02/06/2012 Document Reviewed: 08/09/2007 [facility] Patient Information 2014 [place].

## 2014-04-03 NOTE — Discharge Summary (Signed)
  Obstetric Discharge Summary Reason for Admission: IOL for IUFD Prenatal Procedures: none Intrapartum Procedures: spontaneous vaginal delivery Postpartum Procedures: none Complications-Operative and Postpartum: none  Hospital Course: Pt was found to Have an IUFD on 5/6 and underwent sucessful IOL with breech delivery. Pt had no complications PP and was discharged PPD#1. Meeting all milestones.  Delivery Note CNM called to the room after at 11:08 PM a non-viable female was delivered via Vaginal, Spontaneous Delivery (Presentation: breech) by RN.  APGAR: 0,0 ; weight: 1lb 3.9oz (563g). Cord clamped and cut by CNM. Large cyst on cord noted. Fetus to anteroom with RN.  Placenta status: Intact, Spontaneous & complete with very gentle cord traction. Sent to  Pathology, to include cultures as well per MFM note earlier today.   Fetus appears macerated and with some anomalies- see autopsy and genetic testing for results.    Anesthesia: Epidural  Episiotomy: None Lacerations: None Est. Blood Loss (mL): 100  Mom to postpartum.  Baby to [place].  Support to pt and family.  Myrtis Ser 04/03/2014, 3:38 AM  H/H: Lab Results  Component Value Date/Time   HGB 12.5 04/02/2014 12:45 PM   HCT 36.2 04/02/2014 12:45 PM    Filed Vitals:   04/03/14 0430  BP: 112/63  Pulse: 72  Temp: 98 F (36.7 C)  Resp: 16    Physical Exam: VSS NAD Abd: Appropriately tender, ND, Fundus @U -2  No c/c/e, Neg homan's sign, neg cords Lochia Appropriate  Discharge Diagnoses: IUFD delivered  Discharge Information: Date: 06/09/2011 Activity: pelvic rest Diet: routine  Medications: Ibuprofen Breast feeding:  No:  Condition: stable Instructions: refer to handout Discharge to: home    Future Appointments Provider Department Dept Phone   04/08/2014 8:30 AM Osborne Oman, MD [facility] [place] [facility] [phone]       Medication List    STOP taking these medications       COMPLETE NATAL [place] 29-1-200 & 250 MG Misc      TAKE these medications       ibuprofen 600 MG tablet  Commonly known as:  ADVIL,MOTRIN  Take 1 tablet (600 mg total) by mouth every 6 (six) hours.           Follow-up Information   Follow up with [facility] [place] [facility]. (As Previously Scheduled)    Specialty:  Obstetrics and Gynecology   Contact information:   [place] [address] [phone]      Allen Norris 04/03/2014,8:01 AM

## 2014-04-03 NOTE — Progress Notes (Signed)
04/03/14 1000  Clinical Encounter Type  Visited With Family (MIL (pt and SO taking a walk))  Visit Type Spiritual support;Social support  Referral From Nurse;Chaplain;Other (Comment) (Chaplain [place], MSM; Vonzella Nipple, RN)  Spiritual Encounters  Spiritual Needs Grief support;Emotional   Melissa Shah and SO Melissa Shah were taking a walk when I attempted visit.  Provided bereavement support to pt's mother-in-law and will follow up with Melissa Shah and Melissa Shah later this morning.  Please also page as needed:  [phone].  Thank you.  [place], [place]

## 2014-04-03 NOTE — Progress Notes (Signed)
Ambulated out  Teaching complete   

## 2014-04-03 NOTE — Progress Notes (Signed)
04/03/14 1100  Clinical Encounter Type  Visited With Patient and family together (SO Melissa Shah and his mom)  Visit Type Follow-up;Spiritual support;Social support  Spiritual Encounters  Spiritual Needs Grief support;Emotional (also Advance Directives info per pt request)   Visited with Melissa Shah and Melissa Shah, who were in an emotionally solid place after digging further into their grief earlier this morning.  We talked about the emotional fluctuations through grief and healing.  Provided pastoral presence, bereavement support, grief education, encouragement, and affirmation.  Reminded family of ongoing chaplain availability for follow-up support.  Provided Advance Directives information per pt request.  Family expressed appreciation for the deep and thoughtful care of the whole [facility] staff.  [place], [place]

## 2014-04-04 NOTE — Discharge Summary (Signed)
Attestation of Attending Supervision of Advanced Practitioner (CNM/NP): Evaluation and management procedures were performed by the Advanced Practitioner under my supervision and collaboration.  I have reviewed the Advanced Practitioner's note and chart, and I agree with the management and plan.  Daleah Coulson 04/04/2014 6:20 AM

## 2014-04-08 ENCOUNTER — Encounter (HOSPITAL_COMMUNITY): Payer: Self-pay | Admitting: *Deleted

## 2014-04-08 ENCOUNTER — Ambulatory Visit (INDEPENDENT_AMBULATORY_CARE_PROVIDER_SITE_OTHER): Payer: 59 | Admitting: Obstetrics & Gynecology

## 2014-04-08 ENCOUNTER — Encounter: Payer: Self-pay | Admitting: Obstetrics & Gynecology

## 2014-04-08 VITALS — BP 129/89 | HR 68 | Wt 171.0 lb

## 2014-04-08 DIAGNOSIS — Z23 Encounter for immunization: Secondary | ICD-10-CM

## 2014-04-08 DIAGNOSIS — O364XX0 Maternal care for intrauterine death, not applicable or unspecified: Secondary | ICD-10-CM

## 2014-04-08 LAB — TORCH-IGM(TOXO/ RUB/ CMV/ HSV) W TITER
CMV IgM: <0.2
HSV 1 IgM Abs: NEGATIVE
HSV 2 IgM Abs: NEGATIVE
RPR SCREEN: NONREACTIVE
Rubella IgM Index: <0.9 (ref <0.90)
Toxoplasma IgM: NEGATIVE

## 2014-04-08 MED ORDER — FOLIC ACID 1 MG PO TABS: 1.0000 mg | ORAL_TABLET | Freq: Every day | ORAL | Status: DC

## 2014-04-08 MED ORDER — TETANUS-DIPHTH-ACELL PERTUSSIS 5-2.5-18.5 LF-MCG/0.5 IM SUSP: 0.5000 mL | Freq: Once | INTRAMUSCULAR | Status: DC

## 2014-04-08 NOTE — Progress Notes (Signed)
Patient seen today after undergoing IOL for IUFD at 51 weeks.  She is fine, has a lot of family support.  Negative studies thus far, chromosome studies pending.  Appropriate support given to patient.  She denies any symptoms, scant bleeding currently.  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  Patient desires Tdap immunization, this was given to her today.  Routine preventative health maintenance measures emphasized.

## 2014-04-09 LAB — HUMAN PARVOVIRUS DNA DETECTION BY PCR: PARVOVIRUS B19 PCR: NOT DETECTED

## 2014-04-30 ENCOUNTER — Telehealth (HOSPITAL_COMMUNITY): Payer: Self-pay

## 2014-04-30 NOTE — Telephone Encounter (Signed)
Called Melissa Shah to discuss her test results.  Ms. Jesly Hartmann had amniocentesis for chromosome analysis and microarray analysis.  Testing was offered because of previous abnormal u/s findings and and IUFD.  The patient is a [employer] employee, thus all testing was performed through [place].  The patient was identified by name and DOB.  We reviewed that the chromosome and microarray analyses are within normal limits.  She understands that this testing does not identify all genetic conditions.  All questions were answered to her satisfaction, she was encouraged to call with additional questions or concerns.  Sharyne Richters, MS Certified Genetic Counselor

## 2014-05-06 ENCOUNTER — Telehealth: Payer: Self-pay | Admitting: *Deleted

## 2014-05-06 NOTE — Telephone Encounter (Signed)
Pt is calling wanting to know results of genetic testing done from her fetal demise.  Please advise.  Thanks

## 2014-05-08 ENCOUNTER — Encounter: Payer: Self-pay | Admitting: *Deleted

## 2014-06-03 ENCOUNTER — Ambulatory Visit (INDEPENDENT_AMBULATORY_CARE_PROVIDER_SITE_OTHER): Payer: 59 | Admitting: Family Medicine

## 2014-06-03 ENCOUNTER — Encounter: Payer: Self-pay | Admitting: Family Medicine

## 2014-06-03 VITALS — BP 125/91 | HR 63 | Ht 62.0 in | Wt 174.0 lb

## 2014-06-03 DIAGNOSIS — Z01419 Encounter for gynecological examination (general) (routine) without abnormal findings: Secondary | ICD-10-CM

## 2014-06-03 DIAGNOSIS — Z1151 Encounter for screening for human papillomavirus (HPV): Secondary | ICD-10-CM

## 2014-06-03 DIAGNOSIS — Z124 Encounter for screening for malignant neoplasm of cervix: Secondary | ICD-10-CM

## 2014-06-03 NOTE — Progress Notes (Signed)
  Subjective:     Melissa Shah is a 28 y.o. female and is here for a comprehensive physical exam. The patient reports no problems.  S/p IUFD in 03/2014.  Reviewed studies including pathology, chromosomes, cultures, APLA w/u.  All were negative. Has irregular cycles coming q 59months. Not too heavy.  History   Social History  . Marital Status: Single    Spouse Name: N/A    Number of Children: N/A  . Years of Education: N/A   Occupational History  . Not on file.   Social History Main Topics  . Smoking status: Never Smoker   . Smokeless tobacco: Never Used  . Alcohol Use: Yes     Comment: rare  . Drug Use: No  . Sexual Activity: Yes   Other Topics Concern  . Not on file   Social History Narrative  . No narrative on file   Health Maintenance  Topic Date Due  . Pap Smear  04/28/2004  . Influenza Vaccine  06/28/2014  . Tetanus/tdap  04/08/2024    The following portions of the patient's history were reviewed and updated as appropriate: allergies, current medications, past family history, past medical history, past social history, past surgical history and problem list.  Review of Systems A comprehensive review of systems was negative.   Objective:    BP 125/91  Pulse 63  Ht 5\' 2"  (1.575 m)  Wt 174 lb (78.926 kg)  BMI 31.82 kg/m2  LMP 05/08/2014 General appearance: alert, cooperative and appears stated age Head: Normocephalic, without obvious abnormality, atraumatic Neck: no adenopathy, supple, symmetrical, trachea midline and thyroid not enlarged, symmetric, no tenderness/mass/nodules Lungs: clear to auscultation bilaterally Breasts: normal appearance, no masses or tenderness Heart: regular rate and rhythm, S1, S2 normal, no murmur, click, rub or gallop Abdomen: soft, non-tender; bowel sounds normal; no masses,  no organomegaly Pelvic: cervix normal in appearance, external genitalia normal, no adnexal masses or tenderness, no cervical motion tenderness, uterus normal  size, shape, and consistency, vagina normal without discharge and area of erythema from previous cervical surgery noted at 8 o'clock. Extremities: extremities normal, atraumatic, no cyanosis or edema Pulses: 2+ and symmetric Skin: Skin color, texture, turgor normal. No rashes or lesions Lymph nodes: Cervical, supraclavicular, and axillary nodes normal. Neurologic: Grossly normal    Assessment:    Healthy female exam.      Plan:    Pap smear today Reviewed issues with cycles and to call us if comes less than q 3 months. Ok to try to attempt pregnancy if desires--preconception counseling given. See After Visit Summary for Counseling Recommendations

## 2014-06-03 NOTE — Patient Instructions (Signed)
Preparing for Pregnancy Before trying to become pregnant, make an appointment with your health care provider (preconception care). The goal is to help you have a healthy, safe pregnancy. At your first appointment, your health care provider will:   Do a complete physical exam, including a Pap test.  Take a complete medical history.  Give you advice and help you resolve any problems. PRECONCEPTION CHECKLIST Here is a list of the basics to cover with your health care provider at your preconception visit:  Medical history.  Tell your health care provider about any diseases you have had. Many diseases can affect your pregnancy.  Include your partner's medical history and family history.  Make sure you have been tested for sexually transmitted infections (STIs). These can affect your pregnancy. In some cases, they can be passed to your baby. Tell your health care provider about any history of STIs.  Make sure your health care provider knows about any previous problems you have had with conception or pregnancy.  Tell your health care provider about any medicine you take. This includes herbal supplements and over-the-counter medicines.  Make sure all your immunizations are up-to-date. You may need to make additional appointments.  Ask your health care provider if you need any vaccinations or if there are any you should avoid.  Diet.  It is especially important to eat a healthy, balanced diet with the right nutrients when you are pregnant.  Ask your health care provider to help you get to a healthy weight before pregnancy.  If you are overweight, you are at higher risk for certain complications. These include high blood pressure, diabetes, and preterm birth.  If you are underweight, you are more likely to have a low-birth-weight baby.  Lifestyle.  Tell your health care provider about lifestyle factors such as alcohol use, drug use, or smoking.  Describe any harmful substances you may  be exposed to at work or home. These can include chemicals, pesticides, and radiation.  Mental health.  Let your health care provider know if you have been feeling depressed or anxious.  Let your health care provider know if you have a history of substance abuse.  Let your health care provider know if you do not feel safe at home. HOME INSTRUCTIONS TO PREPARE FOR PREGNANCY Follow your health care provider's advice and instructions.   Keep an accurate record of your menstrual periods. This makes it easier for your health care provider to determine your baby's due date.  Begin taking prenatal vitamins and folic acid supplements daily. Take them as directed by your health care provider.  Eat a balanced diet. Get help from a nutrition counselor if you have questions or need help.  Get regular exercise. Try to be active for at least 30 minutes a day most days of the week.  Quit smoking, if you smoke.  Do not drink alcohol.  Do not take illegal drugs.  Get medical problems, such as diabetes or high blood pressure, under control.  If you have diabetes, make sure you do the following:  Have good blood sugar control. If you have type 1 diabetes, use multiple daily doses of insulin. Do not use split-dose or premixed insulin.  Have an eye exam by a qualified eye care professional trained in caring for people with diabetes.  Get evaluated by your health care provider for cardiovascular disease.  Get to a healthy weight. If you are overweight or obese, reduce your weight with the help of a qualified health professional such  as a regisitered dietitian. Ask your health care provider what the right weight range is for you. HOW DO I KNOW I AM PREGNANT? You may be pregnant if you have been sexually active and you miss your period. Symptoms of early pregnancy include:   Mild cramping.  Very light vaginal bleeding (spotting).  Feeling unusually tired.  Morning sickness. If you have any of  these symptoms, take a home pregnancy test. These tests look for a hormone called human chorionic gonadotropin (hCG) in your urine. Your body begins to make this hormone during early pregnancy. These tests are very accurate. Wait until at least the first day you miss your period to take one. If you get a positive result, call your health care provider to make appointments for prenatal care. WHAT SHOULD I DO IF I BECOME PREGNANT?  Make an appointment with your health care provider by week 12 of your pregnancy at the latest.  Do not smoke. Smoking can be harmful to your baby.  Do not drink alcoholic beverages. Alcohol is related to a number of birth defects.  Avoid toxic odors and chemicals.  You may continue to have sexual intercourse if it does not cause pain or other problems, such as vaginal bleeding. Document Released: 10/27/2008 Document Revised: 11/19/2013 Document Reviewed: 10/21/2013 [facility] Patient Information 2015 [place], [place]. This information is not intended to replace advice given to you by your health care provider. Make sure you discuss any questions you have with your health care provider. Preventive Care for Adults A healthy lifestyle and preventive care can promote health and wellness. Preventive health guidelines for women include the following key practices.  A routine yearly physical is a good way to check with your health care provider about your health and preventive screening. It is a chance to share any concerns and updates on your health and to receive a thorough exam.  Visit your dentist for a routine exam and preventive care every 6 months. Brush your teeth twice a day and floss once a day. Good oral hygiene prevents tooth decay and gum disease.  The frequency of eye exams is based on your age, health, family medical history, use of contact lenses, and other factors. Follow your health care provider's recommendations for frequency of eye exams.  Eat a healthy  diet. Foods like vegetables, fruits, whole grains, low-fat dairy products, and lean protein foods contain the nutrients you need without too many calories. Decrease your intake of foods high in solid fats, added sugars, and salt. Eat the right amount of calories for you.Get information about a proper diet from your health care provider, if necessary.  Regular physical exercise is one of the most important things you can do for your health. Most adults should get at least 150 minutes of moderate-intensity exercise (any activity that increases your heart rate and causes you to sweat) each week. In addition, most adults need muscle-strengthening exercises on 2 or more days a week.  Maintain a healthy weight. The body mass index (BMI) is a screening tool to identify possible weight problems. It provides an estimate of body fat based on height and weight. Your health care provider can find your BMI, and can help you achieve or maintain a healthy weight.For adults 20 years and older:  A BMI below 18.5 is considered underweight.  A BMI of 18.5 to 24.9 is normal.  A BMI of 25 to 29.9 is considered overweight.  A BMI of 30 and above is considered obese.  Maintain  normal blood lipids and cholesterol levels by exercising and minimizing your intake of saturated fat. Eat a balanced diet with plenty of fruit and vegetables. Blood tests for lipids and cholesterol should begin at age 80 and be repeated every 5 years. If your lipid or cholesterol levels are high, you are over 50, or you are at high risk for heart disease, you may need your cholesterol levels checked more frequently.Ongoing high lipid and cholesterol levels should be treated with medicines if diet and exercise are not working.  If you smoke, find out from your health care provider how to quit. If you do not use tobacco, do not start.  Lung cancer screening is recommended for adults aged 12-80 years who are at high risk for developing lung cancer  because of a history of smoking. A yearly low-dose CT scan of the lungs is recommended for people who have at least a 30-pack-year history of smoking and are a current smoker or have quit within the past 15 years. A pack year of smoking is smoking an average of 1 pack of cigarettes a day for 1 year (for example: 1 pack a day for 30 years or 2 packs a day for 15 years). Yearly screening should continue until the smoker has stopped smoking for at least 15 years. Yearly screening should be stopped for people who develop a health problem that would prevent them from having lung cancer treatment.  If you are pregnant, do not drink alcohol. If you are breastfeeding, be very cautious about drinking alcohol. If you are not pregnant and choose to drink alcohol, do not have more than 1 drink per day. One drink is considered to be 12 ounces (355 mL) of beer, 5 ounces (148 mL) of wine, or 1.5 ounces (44 mL) of liquor.  Avoid use of street drugs. Do not share needles with anyone. Ask for help if you need support or instructions about stopping the use of drugs.  High blood pressure causes heart disease and increases the risk of stroke. Your blood pressure should be checked at least every 1 to 2 years. Ongoing high blood pressure should be treated with medicines if weight loss and exercise do not work.  If you are 47-49 years old, ask your health care provider if you should take aspirin to prevent strokes.  Diabetes screening involves taking a blood sample to check your fasting blood sugar level. This should be done once every 3 years, after age 66, if you are within normal weight and without risk factors for diabetes. Testing should be considered at a younger age or be carried out more frequently if you are overweight and have at least 1 risk factor for diabetes.  Breast cancer screening is essential preventive care for women. You should practice "breast self-awareness." This means understanding the normal appearance  and feel of your breasts and may include breast self-examination. Any changes detected, no matter how small, should be reported to a health care provider. Women in their 44s and 30s should have a clinical breast exam (CBE) by a health care provider as part of a regular health exam every 1 to 3 years. After age 45, women should have a CBE every year. Starting at age 67, women should consider having a mammogram (breast X-ray test) every year. Women who have a family history of breast cancer should talk to their health care provider about genetic screening. Women at a high risk of breast cancer should talk to their health care providers about  having an MRI and a mammogram every year.  Breast cancer gene (BRCA)-related cancer risk assessment is recommended for women who have family members with BRCA-related cancers. BRCA-related cancers include breast, ovarian, tubal, and peritoneal cancers. Having family members with these cancers may be associated with an increased risk for harmful changes (mutations) in the breast cancer genes BRCA1 and BRCA2. Results of the assessment will determine the need for genetic counseling and BRCA1 and BRCA2 testing.  Routine pelvic exams to screen for cancer are no longer recommended for nonpregnant women who are considered low risk for cancer of the pelvic organs (ovaries, uterus, and vagina) and who do not have symptoms. Ask your health care provider if a screening pelvic exam is right for you.  If you have had past treatment for cervical cancer or a condition that could lead to cancer, you need Pap tests and screening for cancer for at least 20 years after your treatment. If Pap tests have been discontinued, your risk factors (such as having a new sexual partner) need to be reassessed to determine if screening should be resumed. Some women have medical problems that increase the chance of getting cervical cancer. In these cases, your health care provider may recommend more  frequent screening and Pap tests.  The HPV test is an additional test that may be used for cervical cancer screening. The HPV test looks for the virus that can cause the cell changes on the cervix. The cells collected during the Pap test can be tested for HPV. The HPV test could be used to screen women aged 105 years and older, and should be used in women of any age who have unclear Pap test results. After the age of 64, women should have HPV testing at the same frequency as a Pap test.  Colorectal cancer can be detected and often prevented. Most routine colorectal cancer screening begins at the age of 81 years and continues through age 31 years. However, your health care provider may recommend screening at an earlier age if you have risk factors for colon cancer. On a yearly basis, your health care provider may provide home test kits to check for hidden blood in the stool. Use of a small camera at the end of a tube, to directly examine the colon (sigmoidoscopy or colonoscopy), can detect the earliest forms of colorectal cancer. Talk to your health care provider about this at age 36, when routine screening begins. Direct exam of the colon should be repeated every 5-10 years through age 26 years, unless early forms of pre-cancerous polyps or small growths are found.  People who are at an increased risk for hepatitis B should be screened for this virus. You are considered at high risk for hepatitis B if:  You were born in a country where hepatitis B occurs often. Talk with your health care provider about which countries are considered high risk.  Your parents were born in a high-risk country and you have not received a shot to protect against hepatitis B (hepatitis B vaccine).  You have HIV or AIDS.  You use needles to inject street drugs.  You live with, or have sex with, someone who has Hepatitis B.  You get hemodialysis treatment.  You take certain medicines for conditions like cancer, organ  transplantation, and autoimmune conditions.  Hepatitis C blood testing is recommended for all people born from [birth year] through 1965 and any individual with known risks for hepatitis C.  Practice safe sex. Use condoms and avoid high-risk  sexual practices to reduce the spread of sexually transmitted infections (STIs). STIs include gonorrhea, chlamydia, syphilis, trichomonas, herpes, HPV, and human immunodeficiency virus (HIV). Herpes, HIV, and HPV are viral illnesses that have no cure. They can result in disability, cancer, and death.  You should be screened for sexually transmitted illnesses (STIs) including gonorrhea and chlamydia if:  You are sexually active and are younger than 24 years.  You are older than 24 years and your health care provider tells you that you are at risk for this type of infection.  Your sexual activity has changed since you were last screened and you are at an increased risk for chlamydia or gonorrhea. Ask your health care provider if you are at risk.  If you are at risk of being infected with HIV, it is recommended that you take a prescription medicine daily to prevent HIV infection. This is called preexposure prophylaxis (PrEP). You are considered at risk if:  You are a heterosexual woman, are sexually active, and are at increased risk for HIV infection.  You take drugs by injection.  You are sexually active with a partner who has HIV.  Talk with your health care provider about whether you are at high risk of being infected with HIV. If you choose to begin PrEP, you should first be tested for HIV. You should then be tested every 3 months for as long as you are taking PrEP.  Osteoporosis is a disease in which the bones lose minerals and strength with aging. This can result in serious bone fractures or breaks. The risk of osteoporosis can be identified using a bone density scan. Women ages 18 years and over and women at risk for fractures or osteoporosis should discuss  screening with their health care providers. Ask your health care provider whether you should take a calcium supplement or vitamin D to reduce the rate of osteoporosis.  Menopause can be associated with physical symptoms and risks. Hormone replacement therapy is available to decrease symptoms and risks. You should talk to your health care provider about whether hormone replacement therapy is right for you.  Use sunscreen. Apply sunscreen liberally and repeatedly throughout the day. You should seek shade when your shadow is shorter than you. Protect yourself by wearing long sleeves, pants, a wide-brimmed hat, and sunglasses year round, whenever you are outdoors.  Once a month, do a whole body skin exam, using a mirror to look at the skin on your back. Tell your health care provider of new moles, moles that have irregular borders, moles that are larger than a pencil eraser, or moles that have changed in shape or color.  Stay current with required vaccines (immunizations).  Influenza vaccine. All adults should be immunized every year.  Tetanus, diphtheria, and acellular pertussis (Td, Tdap) vaccine. Pregnant women should receive 1 dose of Tdap vaccine during each pregnancy. The dose should be obtained regardless of the length of time since the last dose. Immunization is preferred during the 27th-36th week of gestation. An adult who has not previously received Tdap or who does not know her vaccine status should receive 1 dose of Tdap. This initial dose should be followed by tetanus and diphtheria toxoids (Td) booster doses every 10 years. Adults with an unknown or incomplete history of completing a 3-dose immunization series with Td-containing vaccines should begin or complete a primary immunization series including a Tdap dose. Adults should receive a Td booster every 10 years.  Varicella vaccine. An adult without evidence of immunity to  varicella should receive 2 doses or a second dose if she has  previously received 1 dose. Pregnant females who do not have evidence of immunity should receive the first dose after pregnancy. This first dose should be obtained before leaving the health care facility. The second dose should be obtained 4-8 weeks after the first dose.  Human papillomavirus (HPV) vaccine. Females aged 13-26 years who have not received the vaccine previously should obtain the 3-dose series. The vaccine is not recommended for use in pregnant females. However, pregnancy testing is not needed before receiving a dose. If a female is found to be pregnant after receiving a dose, no treatment is needed. In that case, the remaining doses should be delayed until after the pregnancy. Immunization is recommended for any person with an immunocompromised condition through the age of 95 years if she did not get any or all doses earlier. During the 3-dose series, the second dose should be obtained 4-8 weeks after the first dose. The third dose should be obtained 24 weeks after the first dose and 16 weeks after the second dose.  Zoster vaccine. One dose is recommended for adults aged 60 years or older unless certain conditions are present.  Measles, mumps, and rubella (MMR) vaccine. Adults born before [birth year] generally are considered immune to measles and mumps. Adults born in [birth year] or later should have 1 or more doses of MMR vaccine unless there is a contraindication to the vaccine or there is laboratory evidence of immunity to each of the three diseases. A routine second dose of MMR vaccine should be obtained at least 28 days after the first dose for students attending postsecondary schools, health care workers, or international travelers. People who received inactivated measles vaccine or an unknown type of measles vaccine during 1963-1967 should receive 2 doses of MMR vaccine. People who received inactivated mumps vaccine or an unknown type of mumps vaccine before 1979 and are at high risk for mumps  infection should consider immunization with 2 doses of MMR vaccine. For females of childbearing age, rubella immunity should be determined. If there is no evidence of immunity, females who are not pregnant should be vaccinated. If there is no evidence of immunity, females who are pregnant should delay immunization until after pregnancy. Unvaccinated health care workers born before [birth year] who lack laboratory evidence of measles, mumps, or rubella immunity or laboratory confirmation of disease should consider measles and mumps immunization with 2 doses of MMR vaccine or rubella immunization with 1 dose of MMR vaccine.  Pneumococcal 13-valent conjugate (PCV13) vaccine. When indicated, a person who is uncertain of her immunization history and has no record of immunization should receive the PCV13 vaccine. An adult aged 90 years or older who has certain medical conditions and has not been previously immunized should receive 1 dose of PCV13 vaccine. This PCV13 should be followed with a dose of pneumococcal polysaccharide (PPSV23) vaccine. The PPSV23 vaccine dose should be obtained at least 8 weeks after the dose of PCV13 vaccine. An adult aged 20 years or older who has certain medical conditions and previously received 1 or more doses of PPSV23 vaccine should receive 1 dose of PCV13. The PCV13 vaccine dose should be obtained 1 or more years after the last PPSV23 vaccine dose.  Pneumococcal polysaccharide (PPSV23) vaccine. When PCV13 is also indicated, PCV13 should be obtained first. All adults aged 65 years and older should be immunized. An adult younger than age 62 years who has certain medical conditions should be immunized. Any  person who resides in a nursing home or long-term care facility should be immunized. An adult smoker should be immunized. People with an immunocompromised condition and certain other conditions should receive both PCV13 and PPSV23 vaccines. People with human immunodeficiency virus (HIV)  infection should be immunized as soon as possible after diagnosis. Immunization during chemotherapy or radiation therapy should be avoided. Routine use of PPSV23 vaccine is not recommended for American Indians, [place] Natives, or people younger than 65 years unless there are medical conditions that require PPSV23 vaccine. When indicated, people who have unknown immunization and have no record of immunization should receive PPSV23 vaccine. One-time revaccination 5 years after the first dose of PPSV23 is recommended for people aged 19-64 years who have chronic kidney failure, nephrotic syndrome, asplenia, or immunocompromised conditions. People who received 1-2 doses of PPSV23 before age 108 years should receive another dose of PPSV23 vaccine at age 79 years or later if at least 5 years have passed since the previous dose. Doses of PPSV23 are not needed for people immunized with PPSV23 at or after age 69 years.  Meningococcal vaccine. Adults with asplenia or persistent complement component deficiencies should receive 2 doses of quadrivalent meningococcal conjugate (MenACWY-D) vaccine. The doses should be obtained at least 2 months apart. Microbiologists working with certain meningococcal bacteria, [place] recruits, people at risk during an outbreak, and people who travel to or live in countries with a high rate of meningitis should be immunized. A first-year college student up through age 55 years who is living in a residence hall should receive a dose if she did not receive a dose on or after her 16th birthday. Adults who have certain high-risk conditions should receive one or more doses of vaccine.  Hepatitis A vaccine. Adults who wish to be protected from this disease, have certain high-risk conditions, work with hepatitis A-infected animals, work in hepatitis A research labs, or travel to or work in countries with a high rate of hepatitis A should be immunized. Adults who were previously unvaccinated and who  anticipate close contact with an international adoptee during the first 60 days after arrival in the [place] from a country with a high rate of hepatitis A should be immunized.  Hepatitis B vaccine. Adults who wish to be protected from this disease, have certain high-risk conditions, may be exposed to blood or other infectious body fluids, are household contacts or sex partners of hepatitis B positive people, are clients or workers in certain care facilities, or travel to or work in countries with a high rate of hepatitis B should be immunized.  Haemophilus influenzae type b (Hib) vaccine. A previously unvaccinated person with asplenia or sickle cell disease or having a scheduled splenectomy should receive 1 dose of Hib vaccine. Regardless of previous immunization, a recipient of a hematopoietic stem cell transplant should receive a 3-dose series 6-12 months after her successful transplant. Hib vaccine is not recommended for adults with HIV infection. Preventive Services / Frequency Ages 73 to 39years  Blood pressure check.** / Every 1 to 2 years.  Lipid and cholesterol check.** / Every 5 years beginning at age 55.  Clinical breast exam.** / Every 3 years for women in their 51s and 24s.  BRCA-related cancer risk assessment.** / For women who have family members with a BRCA-related cancer (breast, ovarian, tubal, or peritoneal cancers).  Pap test.** / Every 2 years from ages 49 through 65. Every 3 years starting at age 13 through age 62 or 68 with a  history of 3 consecutive normal Pap tests.  HPV screening.** / Every 3 years from ages 26 through ages 17 to 74 with a history of 3 consecutive normal Pap tests.  Hepatitis C blood test.** / For any individual with known risks for hepatitis C.  Skin self-exam. / Monthly.  Influenza vaccine. / Every year.  Tetanus, diphtheria, and acellular pertussis (Tdap, Td) vaccine.** / Consult your health care provider. Pregnant women should receive 1  dose of Tdap vaccine during each pregnancy. 1 dose of Td every 10 years.  Varicella vaccine.** / Consult your health care provider. Pregnant females who do not have evidence of immunity should receive the first dose after pregnancy.  HPV vaccine. / 3 doses over 6 months, if 75 and younger. The vaccine is not recommended for use in pregnant females. However, pregnancy testing is not needed before receiving a dose.  Measles, mumps, rubella (MMR) vaccine.** / You need at least 1 dose of MMR if you were born in 1957 or later. You may also need a 2nd dose. For females of childbearing age, rubella immunity should be determined. If there is no evidence of immunity, females who are not pregnant should be vaccinated. If there is no evidence of immunity, females who are pregnant should delay immunization until after pregnancy.  Pneumococcal 13-valent conjugate (PCV13) vaccine.** / Consult your health care provider.  Pneumococcal polysaccharide (PPSV23) vaccine.** / 1 to 2 doses if you smoke cigarettes or if you have certain conditions.  Meningococcal vaccine.** / 1 dose if you are age 43 to 57 years and a Market researcher living in a residence hall, or have one of several medical conditions, you need to get vaccinated against meningococcal disease. You may also need additional booster doses.  Hepatitis A vaccine.** / Consult your health care provider.  Hepatitis B vaccine.** / Consult your health care provider.  Haemophilus influenzae type b (Hib) vaccine.** / Consult your health care provider. Ages 55 to 64years  Blood pressure check.** / Every 1 to 2 years.  Lipid and cholesterol check.** / Every 5 years beginning at age 22 years.  Lung cancer screening. / Every year if you are aged 59-80 years and have a 30-pack-year history of smoking and currently smoke or have quit within the past 15 years. Yearly screening is stopped once you have quit smoking for at least 15 years or develop a health  problem that would prevent you from having lung cancer treatment.  Clinical breast exam.** / Every year after age 80 years.  BRCA-related cancer risk assessment.** / For women who have family members with a BRCA-related cancer (breast, ovarian, tubal, or peritoneal cancers).  Mammogram.** / Every year beginning at age 19 years and continuing for as long as you are in good health. Consult with your health care provider.  Pap test.** / Every 3 years starting at age 7 years through age 74 or 61 years with a history of 3 consecutive normal Pap tests.  HPV screening.** / Every 3 years from ages 57 years through ages 40 to 35 years with a history of 3 consecutive normal Pap tests.  Fecal occult blood test (FOBT) of stool. / Every year beginning at age 40 years and continuing until age 69 years. You may not need to do this test if you get a colonoscopy every 10 years.  Flexible sigmoidoscopy or colonoscopy.** / Every 5 years for a flexible sigmoidoscopy or every 10 years for a colonoscopy beginning at age 29 years and continuing until  age 73 years.  Hepatitis C blood test.** / For all people born from [birth year] through 1965 and any individual with known risks for hepatitis C.  Skin self-exam. / Monthly.  Influenza vaccine. / Every year.  Tetanus, diphtheria, and acellular pertussis (Tdap/Td) vaccine.** / Consult your health care provider. Pregnant women should receive 1 dose of Tdap vaccine during each pregnancy. 1 dose of Td every 10 years.  Varicella vaccine.** / Consult your health care provider. Pregnant females who do not have evidence of immunity should receive the first dose after pregnancy.  Zoster vaccine.** / 1 dose for adults aged 72 years or older.  Measles, mumps, rubella (MMR) vaccine.** / You need at least 1 dose of MMR if you were born in 1957 or later. You may also need a 2nd dose. For females of childbearing age, rubella immunity should be determined. If there is no evidence of  immunity, females who are not pregnant should be vaccinated. If there is no evidence of immunity, females who are pregnant should delay immunization until after pregnancy.  Pneumococcal 13-valent conjugate (PCV13) vaccine.** / Consult your health care provider.  Pneumococcal polysaccharide (PPSV23) vaccine.** / 1 to 2 doses if you smoke cigarettes or if you have certain conditions.  Meningococcal vaccine.** / Consult your health care provider.  Hepatitis A vaccine.** / Consult your health care provider.  Hepatitis B vaccine.** / Consult your health care provider.  Haemophilus influenzae type b (Hib) vaccine.** / Consult your health care provider. Ages 59 years and over  Blood pressure check.** / Every 1 to 2 years.  Lipid and cholesterol check.** / Every 5 years beginning at age 43 years.  Lung cancer screening. / Every year if you are aged 16-80 years and have a 30-pack-year history of smoking and currently smoke or have quit within the past 15 years. Yearly screening is stopped once you have quit smoking for at least 15 years or develop a health problem that would prevent you from having lung cancer treatment.  Clinical breast exam.** / Every year after age 76 years.  BRCA-related cancer risk assessment.** / For women who have family members with a BRCA-related cancer (breast, ovarian, tubal, or peritoneal cancers).  Mammogram.** / Every year beginning at age 31 years and continuing for as long as you are in good health. Consult with your health care provider.  Pap test.** / Every 3 years starting at age 74 years through age 84 or 78 years with 3 consecutive normal Pap tests. Testing can be stopped between 65 and 70 years with 3 consecutive normal Pap tests and no abnormal Pap or HPV tests in the past 10 years.  HPV screening.** / Every 3 years from ages 36 years through ages 26 or 68 years with a history of 3 consecutive normal Pap tests. Testing can be stopped between 65 and 70  years with 3 consecutive normal Pap tests and no abnormal Pap or HPV tests in the past 10 years.  Fecal occult blood test (FOBT) of stool. / Every year beginning at age 48 years and continuing until age 59 years. You may not need to do this test if you get a colonoscopy every 10 years.  Flexible sigmoidoscopy or colonoscopy.** / Every 5 years for a flexible sigmoidoscopy or every 10 years for a colonoscopy beginning at age 43 years and continuing until age 31 years.  Hepatitis C blood test.** / For all people born from [birth year] through 1965 and any individual with known risks for hepatitis C.  Osteoporosis screening.** / A one-time screening for women ages 36 years and over and women at risk for fractures or osteoporosis.  Skin self-exam. / Monthly.  Influenza vaccine. / Every year.  Tetanus, diphtheria, and acellular pertussis (Tdap/Td) vaccine.** / 1 dose of Td every 10 years.  Varicella vaccine.** / Consult your health care provider.  Zoster vaccine.** / 1 dose for adults aged 31 years or older.  Pneumococcal 13-valent conjugate (PCV13) vaccine.** / Consult your health care provider.  Pneumococcal polysaccharide (PPSV23) vaccine.** / 1 dose for all adults aged 25 years and older.  Meningococcal vaccine.** / Consult your health care provider.  Hepatitis A vaccine.** / Consult your health care provider.  Hepatitis B vaccine.** / Consult your health care provider.  Haemophilus influenzae type b (Hib) vaccine.** / Consult your health care provider. ** Family history and personal history of risk and conditions may change your health care provider's recommendations. Document Released: 01/10/2002 Document Revised: 11/19/2013 Document Reviewed: 04/11/2011 [facility] Patient Information 2015 [place], [place]. This information is not intended to replace advice given to you by your health care provider. Make sure you discuss any questions you have with your health care provider.

## 2014-06-04 LAB — CYTOLOGY - PAP

## 2014-08-20 ENCOUNTER — Encounter: Payer: Self-pay | Admitting: Obstetrics and Gynecology

## 2014-08-20 ENCOUNTER — Ambulatory Visit (INDEPENDENT_AMBULATORY_CARE_PROVIDER_SITE_OTHER): Payer: 59 | Admitting: Obstetrics and Gynecology

## 2014-08-20 VITALS — BP 122/85 | HR 86 | Wt 177.4 lb

## 2014-08-20 DIAGNOSIS — O09299 Supervision of pregnancy with other poor reproductive or obstetric history, unspecified trimester: Secondary | ICD-10-CM | POA: Insufficient documentation

## 2014-08-20 DIAGNOSIS — E669 Obesity, unspecified: Secondary | ICD-10-CM | POA: Insufficient documentation

## 2014-08-20 DIAGNOSIS — O09891 Supervision of other high risk pregnancies, first trimester: Secondary | ICD-10-CM

## 2014-08-20 DIAGNOSIS — O09899 Supervision of other high risk pregnancies, unspecified trimester: Secondary | ICD-10-CM

## 2014-08-20 DIAGNOSIS — O99210 Obesity complicating pregnancy, unspecified trimester: Secondary | ICD-10-CM | POA: Insufficient documentation

## 2014-08-20 DIAGNOSIS — Z3481 Encounter for supervision of other normal pregnancy, first trimester: Secondary | ICD-10-CM

## 2014-08-20 DIAGNOSIS — O09291 Supervision of pregnancy with other poor reproductive or obstetric history, first trimester: Secondary | ICD-10-CM

## 2014-08-20 DIAGNOSIS — Z3480 Encounter for supervision of other normal pregnancy, unspecified trimester: Secondary | ICD-10-CM | POA: Insufficient documentation

## 2014-08-20 LAB — OB RESULTS CONSOLE RUBELLA ANTIBODY, IGM: RUBELLA: IMMUNE

## 2014-08-20 LAB — OB RESULTS CONSOLE ABO/RH: RH Type: POSITIVE

## 2014-08-20 MED ORDER — FOLIC ACID 1 MG PO TABS: 1.0000 mg | ORAL_TABLET | Freq: Every day | ORAL | Status: DC

## 2014-08-20 NOTE — Addendum Note (Signed)
Addended by: Erik Obey on: 08/20/2014 09:24 AM   Modules accepted: Orders

## 2014-08-20 NOTE — Progress Notes (Signed)
Bedside ultrasound shows gestational sac measuring 5weeks 4 days which is consistent with LMP dating by one day.  Fetal pole is not visualized.

## 2014-08-20 NOTE — Progress Notes (Signed)
   Subjective:    Melissa Shah is a G2P0100 [redacted]w[redacted]d being seen today for her first obstetrical visit.  Her obstetrical history is significant for previous IUFD at 64 weeks. Patient does intend to breast feed. Pregnancy history fully reviewed.  Patient reports no complaints.  Filed Vitals:   08/20/14 0837  BP: 122/85  Pulse: 86  Weight: 177 lb 6.4 oz (80.468 kg)    HISTORY: OB History  Gravida Para Term Preterm AB SAB TAB Ectopic Multiple Living  2 1  1           # Outcome Date GA Lbr Len/2nd Weight Sex Delivery Anes PTL Lv  2 CUR           1 PRE 04/02/14 [redacted]w[redacted]d  1 lb 3.9 oz (0.563 kg) M SVD EPI  SB     Past Medical History  Diagnosis Date  . S/P ACL surgery 2013    complete replacement cadaver acl  . Fibrocystic breast 2011    2011-2013 monitored  . Hemorrhoid 2015   Past Surgical History  Procedure Laterality Date  . Cervical polyp removal  2012  . Anterior cruciate ligament repair     Family History  Problem Relation Age of Onset  . Fibroids Mother   . Fibrocystic breast disease Mother   . Diabetes Father   . Hypertension Maternal Grandmother      Exam    Uterus:     Pelvic Exam:    Perineum: Hemorrhoids   Vulva: normal   Vagina:  normal mucosa, normal discharge   pH:    Cervix: closed and long   Adnexa: no mass, fullness, tenderness   Bony Pelvis: gynecoid  System: Breast:  normal appearance, no masses or tenderness   Skin: normal coloration and turgor, no rashes    Neurologic: oriented, no focal deficits   Extremities: normal strength, tone, and muscle mass   HEENT extra ocular movement intact   Mouth/Teeth mucous membranes moist, pharynx normal without lesions and dental hygiene good   Neck supple and no masses   Cardiovascular: regular rate and rhythm   Respiratory:  chest clear, no wheezing, crepitations, rhonchi, normal symmetric air entry   Abdomen: soft, non-tender; bowel sounds normal; no masses,  no organomegaly   Urinary:        Assessment:    Pregnancy: G2P0100 Patient Active Problem List   Diagnosis Date Noted  . Supervision of other high risk pregnancy, antepartum 08/20/2014    Priority: Medium  . Prior pregnancy with fetal demise, antepartum 08/20/2014    Priority: Medium  . Intrauterine fetal death in pregnancy, antepartum 04/02/2014        Plan:     Initial labs drawn. Prenatal vitamins. Problem list reviewed and updated. Genetic Screening discussed : NIPS testing requested- to be ordered at a subsequent visit with Fragil X testing as per genetic counseling recommendations.  Ultrasound discussed; fetal survey: requested.  Follow up in 4 weeks. 50% of 30 min visit spent on counseling and coordination of care.     Melissa Shah 08/20/2014

## 2014-08-20 NOTE — Addendum Note (Signed)
Addended by: Erik Obey on: 08/20/2014 09:33 AM   Modules accepted: Orders

## 2014-08-21 LAB — PRENATAL PROFILE I(LABCORP)
ANTIBODY SCREEN: NEGATIVE
Basophils Absolute: 0 10*3/uL (ref 0.0–0.2)
Basos: 0 %
EOS ABS: 0.2 10*3/uL (ref 0.0–0.4)
EOS: 3 %
HCT: 39.4 % (ref 34.0–46.6)
HEP B S AG: NEGATIVE
Hemoglobin: 13.2 g/dL (ref 11.1–15.9)
IMMATURE GRANULOCYTES: 0 %
Immature Grans (Abs): 0 10*3/uL (ref 0.0–0.1)
LYMPHS ABS: 2 10*3/uL (ref 0.7–3.1)
Lymphs: 30 %
MCH: 28.9 pg (ref 26.6–33.0)
MCHC: 33.5 g/dL (ref 31.5–35.7)
MCV: 86 fL (ref 79–97)
MONOS ABS: 0.5 10*3/uL (ref 0.1–0.9)
Monocytes: 8 %
Neutrophils Absolute: 4 10*3/uL (ref 1.4–7.0)
Neutrophils Relative %: 59 %
PLATELETS: 341 10*3/uL (ref 150–379)
RBC: 4.57 x10E6/uL (ref 3.77–5.28)
RDW: 13.5 % (ref 12.3–15.4)
RH TYPE: POSITIVE
RPR: NONREACTIVE
Rubella Antibodies, IGG: 1.92 index (ref >0.99)
WBC: 6.8 10*3/uL (ref 3.4–10.8)

## 2014-08-21 LAB — HIV ANTIBODY (ROUTINE TESTING W REFLEX)
HIV 1/O/2 Abs-Index Value: <1 (ref <1.00)
HIV-1/HIV-2 Ab: NONREACTIVE

## 2014-08-22 LAB — CULTURE, URINE COMPREHENSIVE

## 2014-08-25 LAB — GC/CHLAMYDIA PROBE AMP
Chlamydia trachomatis, NAA: NEGATIVE
NEISSERIA GONORRHOEAE BY PCR: NEGATIVE

## 2014-08-25 LAB — SPECIMEN STATUS REPORT

## 2014-09-10 ENCOUNTER — Ambulatory Visit (INDEPENDENT_AMBULATORY_CARE_PROVIDER_SITE_OTHER): Payer: 59 | Admitting: Family Medicine

## 2014-09-10 ENCOUNTER — Encounter: Payer: Self-pay | Admitting: Family Medicine

## 2014-09-10 VITALS — BP 124/81 | HR 85 | Wt 178.8 lb

## 2014-09-10 DIAGNOSIS — O09891 Supervision of other high risk pregnancies, first trimester: Secondary | ICD-10-CM

## 2014-09-10 IMAGING — US US OB FOLLOW-UP
1 series · 12 of 28 positions shown · non-contrast
Comparison: none

[Series 1: us ob follow-up · 0.19mm/px · 88 acquisitions, 12 frames shown]
[im 4/88]
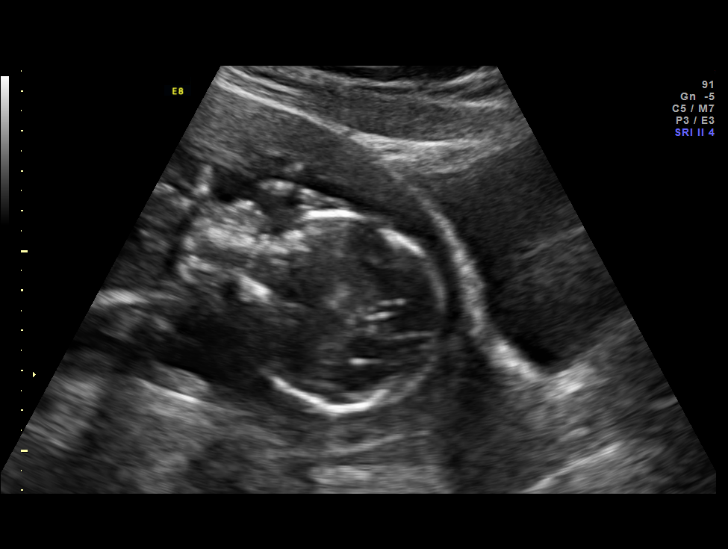
[im 10/88]
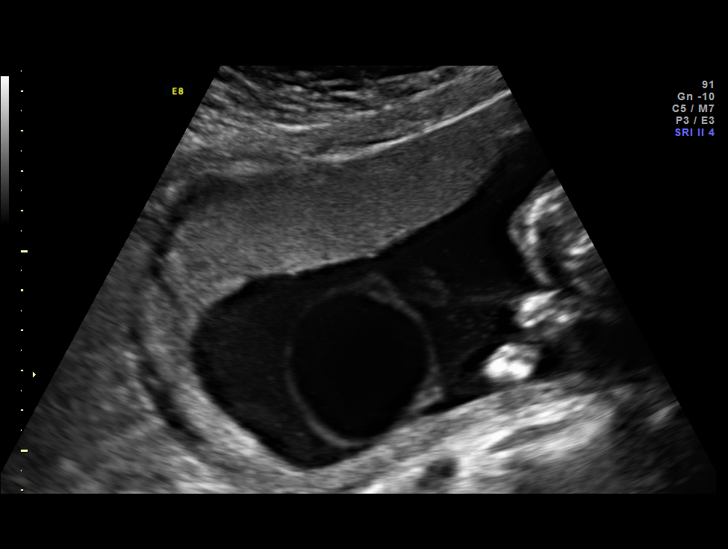
[im 17/88]
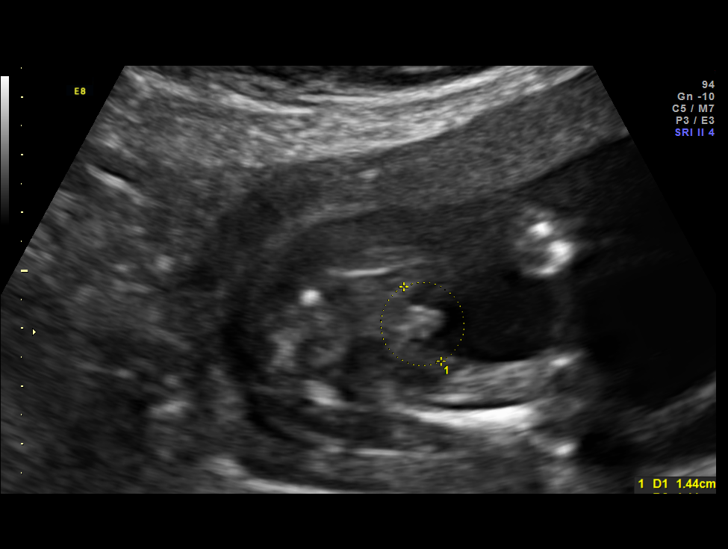
[im 26/88]
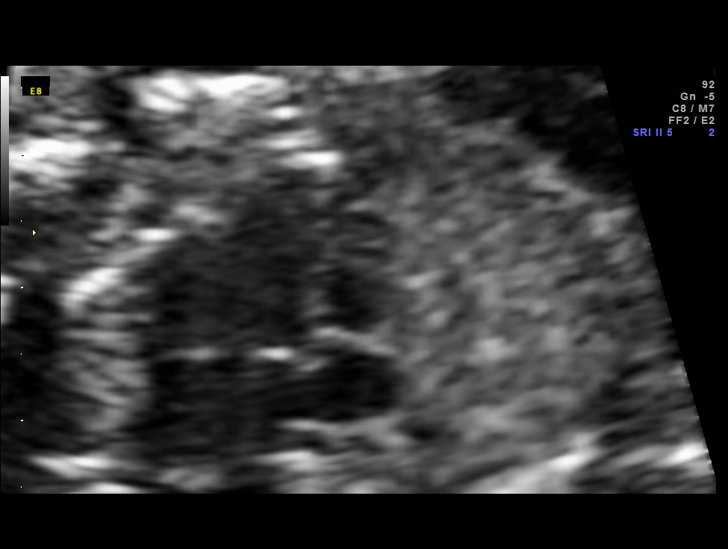
[im 33/88]
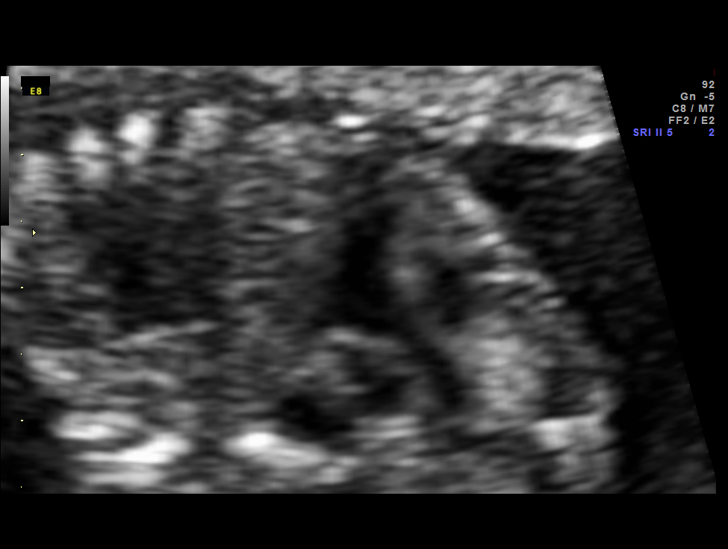
[im 39/88]
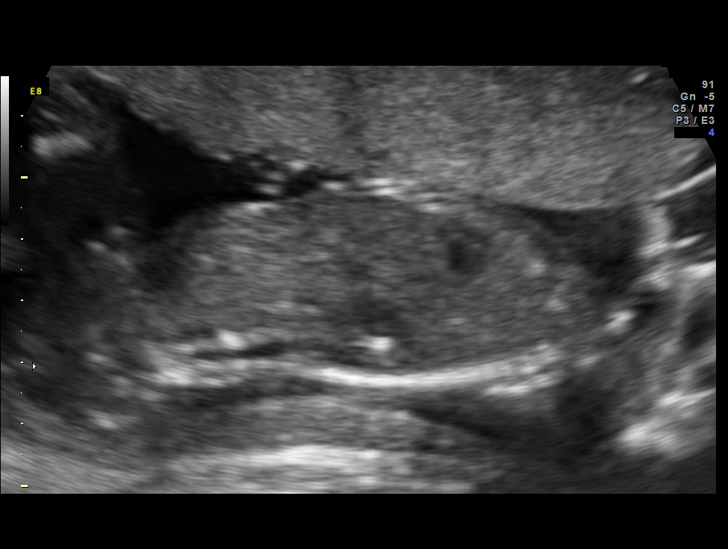
[im 49/88]
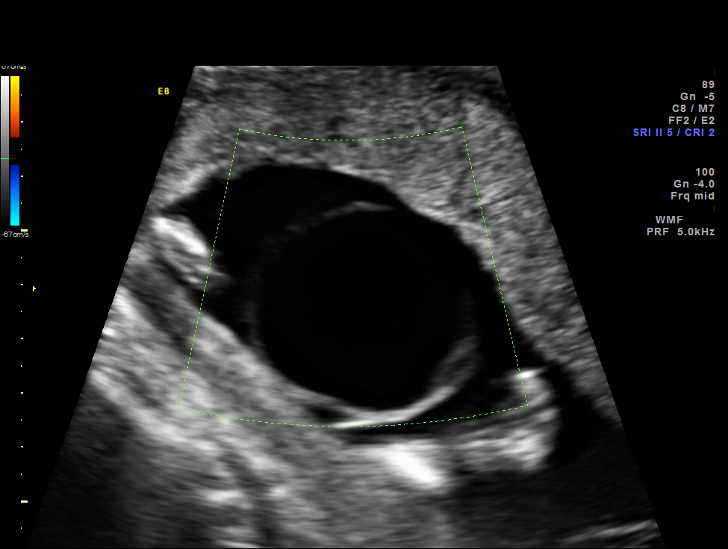
[im 55/88]
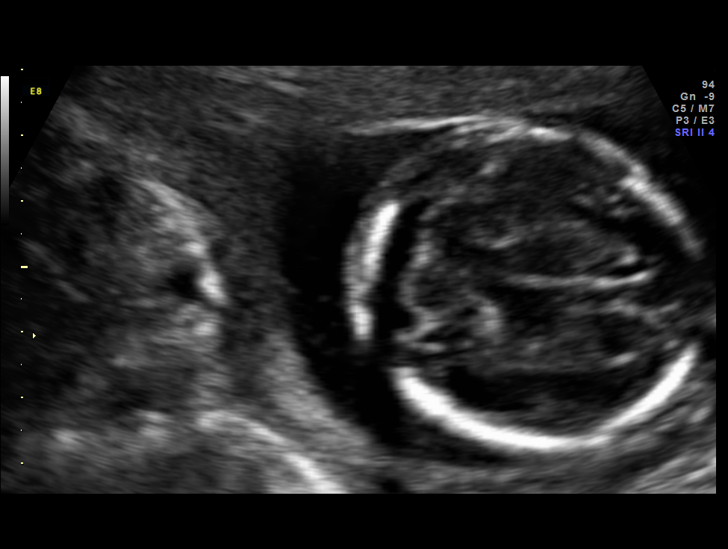
[im 62/88]
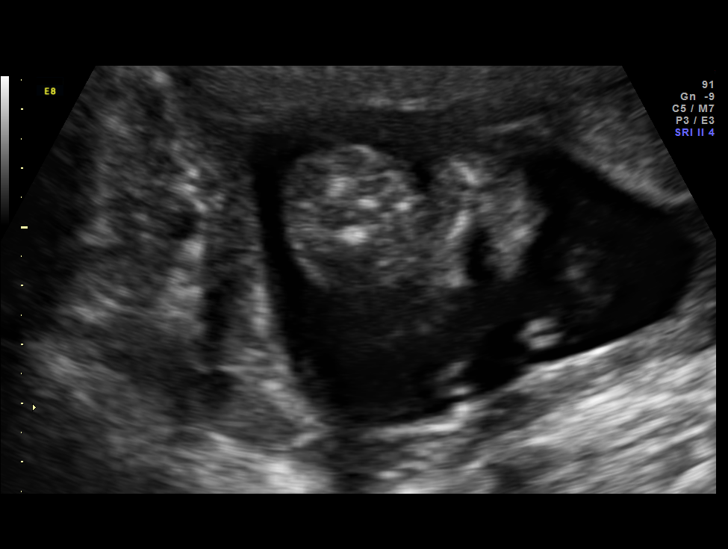
[im 71/88]
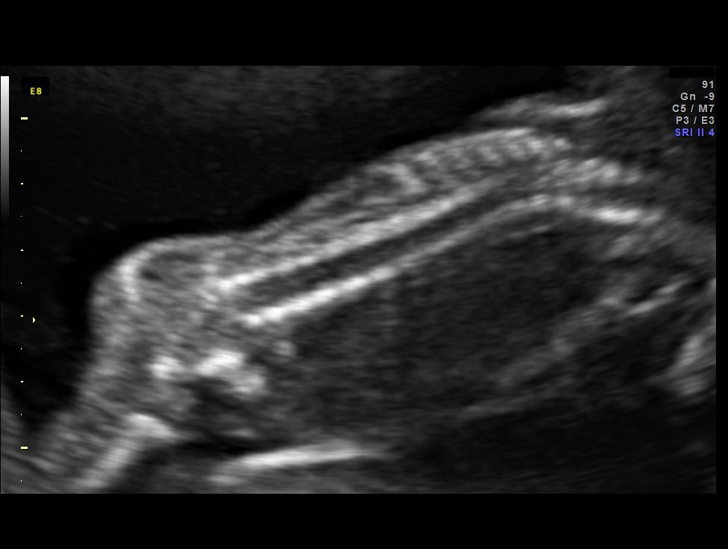
[im 78/88]
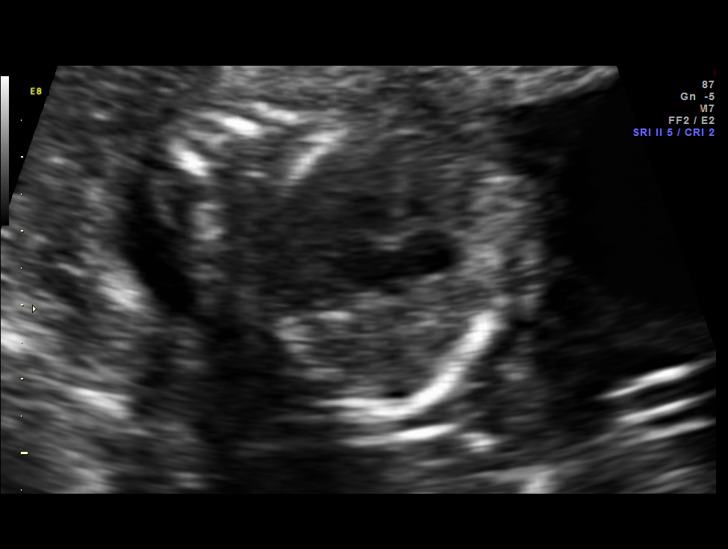
[im 84/88]
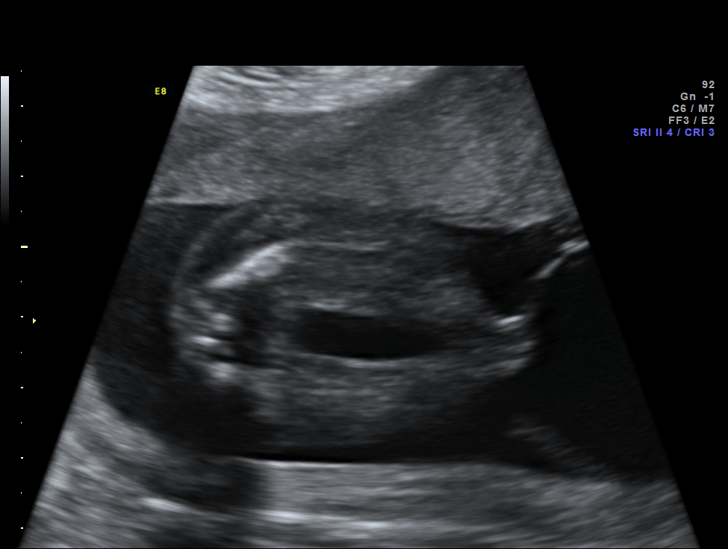

[12 of 28 positions shown; findings below may reference images not displayed]

OBSTETRICS REPORT
                      (Signed Final 02/18/2014 [DATE])

Service(s) Provided

 US OB FOLLOW UP                                       76816.1
Indications

 Fetal abnormality - other known or suspected
 (megacystis, umbilical cord cysts)
 Low risk NIPS
Fetal Evaluation

 Num Of Fetuses:    1
 Fetal Heart Rate:  153                          bpm
 Cardiac Activity:  Observed
 Presentation:      Cephalic
 Placenta:          Anterior Fundal, above
                    cervical os
 P. Cord            Previously Visualized
 Insertion:

 Amniotic Fluid
 AFI FV:      Subjectively within normal limits
                                             Larg Pckt:     3.9  cm
Biometry

 BPD:     46.5  mm     G. Age:  20w 0d                CI:         82.3   70 - 86
 OFD:     56.5  mm                                    FL/HC:      19.0   16.1 -

 HC:     164.4  mm     G. Age:  19w 1d       31  %    HC/AC:      1.21   1.09 -

 AC:     135.7  mm     G. Age:  19w 0d       32  %    FL/BPD:
 FL:      31.2  mm     G. Age:  19w 5d       52  %    FL/AC:      23.0   20 - 24
 HUM:     30.1  mm     G. Age:  20w 0d       64  %

 Est. FW:     287  gm    0 lb 10 oz      45  %
Gestational Age

 LMP:           23w 0d        Date:  09/10/13                 EDD:   06/17/14
 U/S Today:     19w 3d                                        EDD:   07/12/14
 Best:          19w 3d     Det. By:  Early Ultrasound         EDD:   07/12/14
                                     (11/25/13)
Anatomy
 Cranium:          Appears normal         Aortic Arch:      Appears normal
 Fetal Cavum:      Appears normal         Ductal Arch:      Appears normal
 Ventricles:       Appears normal         Diaphragm:        Appears normal
 Choroid Plexus:   Appears normal         Stomach:          Appears normal, left
                                                            sided
 Cerebellum:       Appears normal         Abdomen:          Appears normal
 Posterior Fossa:  Appears normal         Abdominal Wall:   Appears nml (cord
                                                            insert, abd wall)
 Nuchal Fold:      Appears normal         Cord Vessels:     Appears normal (3
                                                            vessel cord)
 Face:             Appears normal         Kidneys:          Appear normal
                   (orbits and profile)
 Lips:             Appears normal         Bladder:          Appears normal
 Palate:           Appears normal         Spine:            Appears normal
 Heart:            Appears normal         Lower             Previously seen
                   (4CH, axis, and        Extremities:
                   situs)
 RVOT:             Appears normal         Upper             Previously seen
                                          Extremities:
 LVOT:             Appears normal

 Other:  Male gender. Heels and 5th digit previously seen.
Cervix Uterus Adnexa

 Cervical Length:    3.1      cm

 Cervix:       Normal appearance by transabdominal scan. Appears
               closed, without funnelling.

 Left Ovary:    Within normal limits.
 Right Ovary:   Not visualized. No adnexal mass visualized.
Impression

 SIUP at 19+3 weeks
 Normal detailed fetal anatomy; bladder somewhat
 elongated??
 Markers of aneuploidy: cord cyst; low risk NIPS
 Normal amniotic fluid volume
 Measurements consistent with prior US
 Umbilical cord cyst (only one could be located) visualized and
 measured 3.7 x 3.4 x 3.9 cms - larger than on last study

Recommendations

 Follow-up ultrasound for growth in 6 weeks

 questions or concerns.

## 2014-09-10 NOTE — Progress Notes (Signed)
Doing well No issues today NIPS next visit.

## 2014-09-10 NOTE — Patient Instructions (Signed)
First Trimester of Pregnancy The first trimester of pregnancy is from week 1 until the end of week 12 (months 1 through 3). A week after a sperm fertilizes an egg, the egg will implant on the wall of the uterus. This embryo will begin to develop into a baby. Genes from you and your partner are forming the baby. The female genes determine whether the baby is a boy or a girl. At 6-8 weeks, the eyes and face are formed, and the heartbeat can be seen on ultrasound. At the end of 12 weeks, all the baby's organs are formed.  Now that you are pregnant, you will want to do everything you can to have a healthy baby. Two of the most important things are to get good prenatal care and to follow your health care provider's instructions. Prenatal care is all the medical care you receive before the baby's birth. This care will help prevent, find, and treat any problems during the pregnancy and childbirth. BODY CHANGES Your body goes through many changes during pregnancy. The changes vary from woman to woman.   You may gain or lose a couple of pounds at first.  You may feel sick to your stomach (nauseous) and throw up (vomit). If the vomiting is uncontrollable, call your health care provider.  You may tire easily.  You may develop headaches that can be relieved by medicines approved by your health care provider.  You may urinate more often. Painful urination may mean you have a bladder infection.  You may develop heartburn as a result of your pregnancy.  You may develop constipation because certain hormones are causing the muscles that push waste through your intestines to slow down.  You may develop hemorrhoids or swollen, bulging veins (varicose veins).  Your breasts may begin to grow larger and become tender. Your nipples may stick out more, and the tissue that surrounds them (areola) may become darker.  Your gums may bleed and may be sensitive to brushing and flossing.  Dark spots or blotches  (chloasma, mask of pregnancy) may develop on your face. This will likely fade after the baby is born.  Your menstrual periods will stop.  You may have a loss of appetite.  You may develop cravings for certain kinds of food.  You may have changes in your emotions from day to day, such as being excited to be pregnant or being concerned that something may go wrong with the pregnancy and baby.  You may have more vivid and strange dreams.  You may have changes in your hair. These can include thickening of your hair, rapid growth, and changes in texture. Some women also have hair loss during or after pregnancy, or hair that feels dry or thin. Your hair will most likely return to normal after your baby is born. WHAT TO EXPECT AT YOUR PRENATAL VISITS During a routine prenatal visit:  You will be weighed to make sure you and the baby are growing normally.  Your blood pressure will be taken.  Your abdomen will be measured to track your baby's growth.  The fetal heartbeat will be listened to starting around week 10 or 12 of your pregnancy.  Test results from any previous visits will be discussed. Your health care provider may ask you:  How you are feeling.  If you are feeling the baby move.  If you have had any abnormal symptoms, such as leaking fluid, bleeding, severe headaches, or abdominal cramping.  If you have any questions. Other tests  that may be performed during your first trimester include:  Blood tests to find your blood type and to check for the presence of any previous infections. They will also be used to check for low iron levels (anemia) and Rh antibodies. Later in the pregnancy, blood tests for diabetes will be done along with other tests if problems develop.  Urine tests to check for infections, diabetes, or protein in the urine.  An ultrasound to confirm the proper growth and development of the baby.  An amniocentesis to check for possible genetic problems.  Fetal  screens for spina bifida and Down syndrome.  You may need other tests to make sure you and the baby are doing well. HOME CARE INSTRUCTIONS  Medicines  Follow your health care provider's instructions regarding medicine use. Specific medicines may be either safe or unsafe to take during pregnancy.  Take your prenatal vitamins as directed.  If you develop constipation, try taking a stool softener if your health care provider approves. Diet  Eat regular, well-balanced meals. Choose a variety of foods, such as meat or vegetable-based protein, fish, milk and low-fat dairy products, vegetables, fruits, and whole grain breads and cereals. Your health care provider will help you determine the amount of weight gain that is right for you.  Avoid raw meat and uncooked cheese. These carry germs that can cause birth defects in the baby.  Eating four or five small meals rather than three large meals a day may help relieve nausea and vomiting. If you start to feel nauseous, eating a few soda crackers can be helpful. Drinking liquids between meals instead of during meals also seems to help nausea and vomiting.  If you develop constipation, eat more high-fiber foods, such as fresh vegetables or fruit and whole grains. Drink enough fluids to keep your urine clear or pale yellow. Activity and Exercise  Exercise only as directed by your health care provider. Exercising will help you:  Control your weight.  Stay in shape.  Be prepared for labor and delivery.  Experiencing pain or cramping in the lower abdomen or low back is a good sign that you should stop exercising. Check with your health care provider before continuing normal exercises.  Try to avoid standing for long periods of time. Move your legs often if you must stand in one place for a long time.  Avoid heavy lifting.  Wear low-heeled shoes, and practice good posture.  You may continue to have sex unless your health care provider directs you  otherwise. Relief of Pain or Discomfort  Wear a good support bra for breast tenderness.   Take warm sitz baths to soothe any pain or discomfort caused by hemorrhoids. Use hemorrhoid cream if your health care provider approves.   Rest with your legs elevated if you have leg cramps or low back pain.  If you develop varicose veins in your legs, wear support hose. Elevate your feet for 15 minutes, 3-4 times a day. Limit salt in your diet. Prenatal Care  Schedule your prenatal visits by the twelfth week of pregnancy. They are usually scheduled monthly at first, then more often in the last 2 months before delivery.  Write down your questions. Take them to your prenatal visits.  Keep all your prenatal visits as directed by your health care provider. Safety  Wear your seat belt at all times when driving.  Make a list of emergency phone numbers, including numbers for family, friends, the hospital, and police and fire departments. General Tips  Ask your health care provider for a referral to a local prenatal education class. Begin classes no later than at the beginning of month 6 of your pregnancy.  Ask for help if you have counseling or nutritional needs during pregnancy. Your health care provider can offer advice or refer you to specialists for help with various needs.  Do not use hot tubs, steam rooms, or saunas.  Do not douche or use tampons or scented sanitary pads.  Do not cross your legs for long periods of time.  Avoid cat litter boxes and soil used by cats. These carry germs that can cause birth defects in the baby and possibly loss of the fetus by miscarriage or stillbirth.  Avoid all smoking, herbs, alcohol, and medicines not prescribed by your health care provider. Chemicals in these affect the formation and growth of the baby.  Schedule a dentist appointment. At home, brush your teeth with a soft toothbrush and be gentle when you floss. SEEK MEDICAL CARE IF:   You have  dizziness.  You have mild pelvic cramps, pelvic pressure, or nagging pain in the abdominal area.  You have persistent nausea, vomiting, or diarrhea.  You have a bad smelling vaginal discharge.  You have pain with urination.  You notice increased swelling in your face, hands, legs, or ankles. SEEK IMMEDIATE MEDICAL CARE IF:   You have a fever.  You are leaking fluid from your vagina.  You have spotting or bleeding from your vagina.  You have severe abdominal cramping or pain.  You have rapid weight gain or loss.  You vomit blood or material that looks like coffee grounds.  You are exposed to [place] measles and have never had them.  You are exposed to fifth disease or chickenpox.  You develop a severe headache.  You have shortness of breath.  You have any kind of trauma, such as from a fall or a car accident. Document Released: 11/08/2001 Document Revised: 03/31/2014 Document Reviewed: 09/24/2013 [facility] Patient Information 2015 [place], [place]. This information is not intended to replace advice given to you by your health care provider. Make sure you discuss any questions you have with your health care provider.  Breastfeeding Deciding to breastfeed is one of the [place] choices you can make for you and your baby. A change in hormones during pregnancy causes your breast tissue to grow and increases the number and size of your milk ducts. These hormones also allow proteins, sugars, and fats from your blood supply to make breast milk in your milk-producing glands. Hormones prevent breast milk from being released before your baby is born as well as prompt milk flow after birth. Once breastfeeding has begun, thoughts of your baby, as well as his or her sucking or crying, can stimulate the release of milk from your milk-producing glands.  BENEFITS OF BREASTFEEDING For Your Baby  Your first milk (colostrum) helps your baby's digestive system function better.   There are antibodies  in your milk that help your baby fight off infections.   Your baby has a lower incidence of asthma, allergies, and sudden infant death syndrome.   The nutrients in breast milk are better for your baby than infant formulas and are designed uniquely for your baby's needs.   Breast milk improves your baby's brain development.   Your baby is less likely to develop other conditions, such as childhood obesity, asthma, or type 2 diabetes mellitus.  For You   Breastfeeding helps to create a very special bond between  you and your baby.   Breastfeeding is convenient. Breast milk is always available at the correct temperature and costs nothing.   Breastfeeding helps to burn calories and helps you lose the weight gained during pregnancy.   Breastfeeding makes your uterus contract to its prepregnancy size faster and slows bleeding (lochia) after you give birth.   Breastfeeding helps to lower your risk of developing type 2 diabetes mellitus, osteoporosis, and breast or ovarian cancer later in life. SIGNS THAT YOUR BABY IS HUNGRY Early Signs of Hunger  Increased alertness or activity.  Stretching.  Movement of the head from side to side.  Movement of the head and opening of the mouth when the corner of the mouth or cheek is stroked (rooting).  Increased sucking sounds, smacking lips, cooing, sighing, or squeaking.  Hand-to-mouth movements.  Increased sucking of fingers or hands. Late Signs of Hunger  Fussing.  Intermittent crying. Extreme Signs of Hunger Signs of extreme hunger will require calming and consoling before your baby will be able to breastfeed successfully. Do not wait for the following signs of extreme hunger to occur before you initiate breastfeeding:   Restlessness.  A loud, strong cry.   Screaming. BREASTFEEDING BASICS Breastfeeding Initiation  Find a comfortable place to sit or lie down, with your neck and back well supported.  Place a pillow or  rolled up blanket under your baby to bring him or her to the level of your breast (if you are seated). Nursing pillows are specially designed to help support your arms and your baby while you breastfeed.  Make sure that your baby's abdomen is facing your abdomen.   Gently massage your breast. With your fingertips, massage from your chest wall toward your nipple in a circular motion. This encourages milk flow. You may need to continue this action during the feeding if your milk flows slowly.  Support your breast with 4 fingers underneath and your thumb above your nipple. Make sure your fingers are well away from your nipple and your baby's mouth.   Stroke your baby's lips gently with your finger or nipple.   When your baby's mouth is open wide enough, quickly bring your baby to your breast, placing your entire nipple and as much of the colored area around your nipple (areola) as possible into your baby's mouth.   More areola should be visible above your baby's upper lip than below the lower lip.   Your baby's tongue should be between his or her lower gum and your breast.   Ensure that your baby's mouth is correctly positioned around your nipple (latched). Your baby's lips should create a seal on your breast and be turned out (everted).  It is common for your baby to suck about 2-3 minutes in order to start the flow of breast milk. Latching Teaching your baby how to latch on to your breast properly is very important. An improper latch can cause nipple pain and decreased milk supply for you and poor weight gain in your baby. Also, if your baby is not latched onto your nipple properly, he or she may swallow some air during feeding. This can make your baby fussy. Burping your baby when you switch breasts during the feeding can help to get rid of the air. However, teaching your baby to latch on properly is still the [place] way to prevent fussiness from swallowing air while breastfeeding. Signs  that your baby has successfully latched on to your nipple:    Silent tugging or silent  sucking, without causing you pain.   Swallowing heard between every 3-4 sucks.    Muscle movement above and in front of his or her ears while sucking.  Signs that your baby has not successfully latched on to nipple:   Sucking sounds or smacking sounds from your baby while breastfeeding.  Nipple pain. If you think your baby has not latched on correctly, slip your finger into the corner of your baby's mouth to break the suction and place it between your baby's gums. Attempt breastfeeding initiation again. Signs of Successful Breastfeeding Signs from your baby:   A gradual decrease in the number of sucks or complete cessation of sucking.   Falling asleep.   Relaxation of his or her body.   Retention of a small amount of milk in his or her mouth.   Letting go of your breast by himself or herself. Signs from you:  Breasts that have increased in firmness, weight, and size 1-3 hours after feeding.   Breasts that are softer immediately after breastfeeding.  Increased milk volume, as well as a change in milk consistency and color by the fifth day of breastfeeding.   Nipples that are not sore, cracked, or bleeding. Signs That Your Randel Books is Getting Enough Milk  Wetting at least 3 diapers in a 24-hour period. The urine should be clear and pale yellow by age 69 days.  At least 3 stools in a 24-hour period by age 69 days. The stool should be soft and yellow.  At least 3 stools in a 24-hour period by age 34 days. The stool should be seedy and yellow.  No loss of weight greater than 10% of birth weight during the first 82 days of age.  Average weight gain of 4-7 ounces (113-198 g) per week after age 55 days.  Consistent daily weight gain by age 81 days, without weight loss after the age of 2 weeks. After a feeding, your baby may spit up a small amount. This is common. BREASTFEEDING FREQUENCY AND  DURATION Frequent feeding will help you make more milk and can prevent sore nipples and breast engorgement. Breastfeed when you feel the need to reduce the fullness of your breasts or when your baby shows signs of hunger. This is called "breastfeeding on demand." Avoid introducing a pacifier to your baby while you are working to establish breastfeeding (the first 4-6 weeks after your baby is born). After this time you may choose to use a pacifier. Research has shown that pacifier use during the first year of a baby's life decreases the risk of sudden infant death syndrome (SIDS). Allow your baby to feed on each breast as long as he or she wants. Breastfeed until your baby is finished feeding. When your baby unlatches or falls asleep while feeding from the first breast, offer the second breast. Because newborns are often sleepy in the first few weeks of life, you may need to awaken your baby to get him or her to feed. Breastfeeding times will vary from baby to baby. However, the following rules can serve as a guide to help you ensure that your baby is properly fed:  Newborns (babies 1 weeks of age or younger) may breastfeed every 1-3 hours.  Newborns should not go longer than 3 hours during the day or 5 hours during the night without breastfeeding.  You should breastfeed your baby a minimum of 8 times in a 24-hour period until you begin to introduce solid foods to your baby at around 6  months of age. BREAST MILK PUMPING Pumping and storing breast milk allows you to ensure that your baby is exclusively fed your breast milk, even at times when you are unable to breastfeed. This is especially important if you are going back to work while you are still breastfeeding or when you are not able to be present during feedings. Your lactation consultant can give you guidelines on how long it is safe to store breast milk.  A breast pump is a machine that allows you to pump milk from your breast into a sterile bottle.  The pumped breast milk can then be stored in a refrigerator or freezer. Some breast pumps are operated by hand, while others use electricity. Ask your lactation consultant which type will work [place] for you. Breast pumps can be purchased, but some hospitals and breastfeeding support groups lease breast pumps on a monthly basis. A lactation consultant can teach you how to hand express breast milk, if you prefer not to use a pump.  CARING FOR YOUR BREASTS WHILE YOU BREASTFEED Nipples can become dry, cracked, and sore while breastfeeding. The following recommendations can help keep your breasts moisturized and healthy:  Avoid using soap on your nipples.   Wear a supportive bra. Although not required, special nursing bras and tank tops are designed to allow access to your breasts for breastfeeding without taking off your entire bra or top. Avoid wearing underwire-style bras or extremely tight bras.  Air dry your nipples for 3-31mnutes after each feeding.   Use only cotton bra pads to absorb leaked breast milk. Leaking of breast milk between feedings is normal.   Use lanolin on your nipples after breastfeeding. Lanolin helps to maintain your skin's normal moisture barrier. If you use pure lanolin, you do not need to wash it off before feeding your baby again. Pure lanolin is not toxic to your baby. You may also hand express a few drops of breast milk and gently massage that milk into your nipples and allow the milk to air dry. In the first few weeks after giving birth, some women experience extremely full breasts (engorgement). Engorgement can make your breasts feel heavy, warm, and tender to the touch. Engorgement peaks within 3-5 days after you give birth. The following recommendations can help ease engorgement:  Completely empty your breasts while breastfeeding or pumping. You may want to start by applying warm, moist heat (in the shower or with warm water-soaked hand towels) just before feeding or  pumping. This increases circulation and helps the milk flow. If your baby does not completely empty your breasts while breastfeeding, pump any extra milk after he or she is finished.  Wear a snug bra (nursing or regular) or tank top for 1-2 days to signal your body to slightly decrease milk production.  Apply ice packs to your breasts, unless this is too uncomfortable for you.  Make sure that your baby is latched on and positioned properly while breastfeeding. If engorgement persists after 48 hours of following these recommendations, contact your health care provider or a lScience writer OVERALL HEALTH CARE RECOMMENDATIONS WHILE BREASTFEEDING  Eat healthy foods. Alternate between meals and snacks, eating 3 of each per day. Because what you eat affects your breast milk, some of the foods may make your baby more irritable than usual. Avoid eating these foods if you are sure that they are negatively affecting your baby.  Drink milk, fruit juice, and water to satisfy your thirst (about 10 glasses a day).   Rest  often, relax, and continue to take your prenatal vitamins to prevent fatigue, stress, and anemia.  Continue breast self-awareness checks.  Avoid chewing and smoking tobacco.  Avoid alcohol and drug use. Some medicines that may be harmful to your baby can pass through breast milk. It is important to ask your health care provider before taking any medicine, including all over-the-counter and prescription medicine as well as vitamin and herbal supplements. It is possible to become pregnant while breastfeeding. If birth control is desired, ask your health care provider about options that will be safe for your baby. SEEK MEDICAL CARE IF:   You feel like you want to stop breastfeeding or have become frustrated with breastfeeding.  You have painful breasts or nipples.  Your nipples are cracked or bleeding.  Your breasts are red, tender, or warm.  You have a swollen area on either  breast.  You have a fever or chills.  You have nausea or vomiting.  You have drainage other than breast milk from your nipples.  Your breasts do not become full before feedings by the fifth day after you give birth.  You feel sad and depressed.  Your baby is too sleepy to eat well.  Your baby is having trouble sleeping.   Your baby is wetting less than 3 diapers in a 24-hour period.  Your baby has less than 3 stools in a 24-hour period.  Your baby's skin or the white part of his or her eyes becomes yellow.   Your baby is not gaining weight by 5 days of age. SEEK IMMEDIATE MEDICAL CARE IF:   Your baby is overly tired (lethargic) and does not want to wake up and feed.  Your baby develops an unexplained fever. Document Released: 11/14/2005 Document Revised: 11/19/2013 Document Reviewed: 05/08/2013 [employer] Patient Information 2015 [employer]. This information is not intended to replace advice given to you by your health care provider. Make sure you discuss any questions you have with your health care provider.  

## 2014-09-10 NOTE — Progress Notes (Signed)
Patient is doing well, no concerns today.

## 2014-09-29 ENCOUNTER — Encounter: Payer: Self-pay | Admitting: Family Medicine

## 2014-10-08 ENCOUNTER — Encounter: Payer: Self-pay | Admitting: Obstetrics & Gynecology

## 2014-10-08 ENCOUNTER — Ambulatory Visit (INDEPENDENT_AMBULATORY_CARE_PROVIDER_SITE_OTHER): Payer: 59 | Admitting: Obstetrics & Gynecology

## 2014-10-08 VITALS — BP 123/82 | HR 132 | Wt 178.0 lb

## 2014-10-08 DIAGNOSIS — Z23 Encounter for immunization: Secondary | ICD-10-CM

## 2014-10-08 DIAGNOSIS — O09891 Supervision of other high risk pregnancies, first trimester: Secondary | ICD-10-CM

## 2014-10-08 DIAGNOSIS — O09291 Supervision of pregnancy with other poor reproductive or obstetric history, first trimester: Secondary | ICD-10-CM

## 2014-10-08 DIAGNOSIS — Z36 Encounter for antenatal screening of mother: Secondary | ICD-10-CM

## 2014-10-08 NOTE — Patient Instructions (Signed)
Return to clinic for any obstetric concerns or go to [facility] for evaluation  

## 2014-10-08 NOTE — Progress Notes (Signed)
Patient is having left side low back and buttock pain; reassured about these symptoms.   Routine obstetric precautions reviewed. Interested in first trimester screening, will schedule today.  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  Anatomy scan ordered Flu shot to be given today. AFP only lab to be done next visit.

## 2014-10-13 ENCOUNTER — Ambulatory Visit (HOSPITAL_COMMUNITY): Admission: RE | Admit: 2014-10-13 | Payer: 59 | Source: Ambulatory Visit

## 2014-10-13 ENCOUNTER — Telehealth: Payer: Self-pay | Admitting: *Deleted

## 2014-10-13 ENCOUNTER — Ambulatory Visit (HOSPITAL_COMMUNITY)
Admission: RE | Admit: 2014-10-13 | Discharge: 2014-10-13 | Disposition: A | Discharge status: Home / Self Care | Payer: 59 | Source: Ambulatory Visit | Attending: Obstetrics & Gynecology | Admitting: Obstetrics & Gynecology

## 2014-10-13 DIAGNOSIS — Z3682 Encounter for antenatal screening for nuchal translucency: Secondary | ICD-10-CM | POA: Insufficient documentation

## 2014-10-13 DIAGNOSIS — O09291 Supervision of pregnancy with other poor reproductive or obstetric history, first trimester: Secondary | ICD-10-CM | POA: Diagnosis not present

## 2014-10-13 DIAGNOSIS — Z3A13 13 weeks gestation of pregnancy: Secondary | ICD-10-CM | POA: Insufficient documentation

## 2014-10-13 DIAGNOSIS — Z36 Encounter for antenatal screening of mother: Secondary | ICD-10-CM | POA: Diagnosis present

## 2014-10-13 NOTE — Telephone Encounter (Signed)
Left message for patient that she needs to call us back regarding informasec test.  Test needs to be redrawn in tan tubes per [facility].

## 2014-10-13 NOTE — Telephone Encounter (Signed)
-----   Message from Osborne Oman, MD sent at 10/13/2014  7:54 AM EST ----- Regarding: What is up with [facility]?   ----- Message -----    From: [facility] Lab Results In Interface    Sent: 10/10/2014   9:39 AM      To: Osborne Oman, MD

## 2014-10-15 ENCOUNTER — Other Ambulatory Visit (INDEPENDENT_AMBULATORY_CARE_PROVIDER_SITE_OTHER): Payer: 59 | Admitting: *Deleted

## 2014-10-15 DIAGNOSIS — O09291 Supervision of pregnancy with other poor reproductive or obstetric history, first trimester: Secondary | ICD-10-CM

## 2014-10-15 DIAGNOSIS — O09891 Supervision of other high risk pregnancies, first trimester: Secondary | ICD-10-CM

## 2014-10-16 LAB — FRAGILE X, PCR REFLEX SOUTHERN

## 2014-10-27 LAB — INFORMASEQ(SM) WITH XY ANALYSIS: GESTATIONAL AGE AT COLLECTION: 13.7 wk

## 2014-10-28 ENCOUNTER — Telehealth: Payer: Self-pay | Admitting: *Deleted

## 2014-10-28 NOTE — Telephone Encounter (Signed)
Patient called for test results.  Amended report shows a normal genetic screen and a female fetus.

## 2014-11-11 ENCOUNTER — Encounter: Payer: Self-pay | Admitting: *Deleted

## 2014-11-12 ENCOUNTER — Encounter: Payer: Self-pay | Admitting: Obstetrics and Gynecology

## 2014-11-12 ENCOUNTER — Ambulatory Visit (INDEPENDENT_AMBULATORY_CARE_PROVIDER_SITE_OTHER): Payer: 59 | Admitting: Obstetrics and Gynecology

## 2014-11-12 ENCOUNTER — Other Ambulatory Visit: Payer: Self-pay | Admitting: Obstetrics and Gynecology

## 2014-11-12 VITALS — BP 108/80 | HR 103 | Wt 179.0 lb

## 2014-11-12 DIAGNOSIS — O99210 Obesity complicating pregnancy, unspecified trimester: Secondary | ICD-10-CM

## 2014-11-12 DIAGNOSIS — E669 Obesity, unspecified: Secondary | ICD-10-CM

## 2014-11-12 DIAGNOSIS — Z36 Encounter for antenatal screening of mother: Secondary | ICD-10-CM

## 2014-11-12 DIAGNOSIS — O09292 Supervision of pregnancy with other poor reproductive or obstetric history, second trimester: Secondary | ICD-10-CM

## 2014-11-12 DIAGNOSIS — O09892 Supervision of other high risk pregnancies, second trimester: Secondary | ICD-10-CM

## 2014-11-12 NOTE — Progress Notes (Signed)
Patient is doing well without complaints. Anatomy ultrasound scheduled for 12/28. AFP today

## 2014-11-13 LAB — INFORMASEQ(SM) WITH XY ANALYSIS

## 2014-11-13 LAB — FRAGILE X, PCR REFLEX SOUTHERN

## 2014-11-14 LAB — AFP, SERUM, OPEN SPINA BIFIDA
AFP MOM: 1.45
AFP VALUE AFPOSL: 60.2 ng/mL
Gest. Age on Collection Date: 17.7 weeks
Maternal Age At EDD: 28.9 years
OSBR RISK 1 IN: 6265
Test Results:: NEGATIVE
WEIGHT: 178 [lb_av]

## 2014-11-24 ENCOUNTER — Encounter (HOSPITAL_COMMUNITY): Payer: Self-pay

## 2014-11-24 ENCOUNTER — Other Ambulatory Visit: Payer: Self-pay | Admitting: Obstetrics & Gynecology

## 2014-11-24 ENCOUNTER — Ambulatory Visit (HOSPITAL_COMMUNITY)
Admission: RE | Admit: 2014-11-24 | Discharge: 2014-11-24 | Disposition: A | Discharge status: Home / Self Care | Payer: 59 | Source: Ambulatory Visit | Attending: Obstetrics & Gynecology | Admitting: Obstetrics & Gynecology

## 2014-11-24 DIAGNOSIS — O09292 Supervision of pregnancy with other poor reproductive or obstetric history, second trimester: Secondary | ICD-10-CM | POA: Diagnosis not present

## 2014-11-24 DIAGNOSIS — Z36 Encounter for antenatal screening of mother: Secondary | ICD-10-CM | POA: Insufficient documentation

## 2014-11-24 DIAGNOSIS — O09299 Supervision of pregnancy with other poor reproductive or obstetric history, unspecified trimester: Secondary | ICD-10-CM | POA: Insufficient documentation

## 2014-11-24 DIAGNOSIS — O09291 Supervision of pregnancy with other poor reproductive or obstetric history, first trimester: Secondary | ICD-10-CM

## 2014-11-24 DIAGNOSIS — Z3A19 19 weeks gestation of pregnancy: Secondary | ICD-10-CM | POA: Insufficient documentation

## 2014-11-28 NOTE — L&D Delivery Note (Cosign Needed)
Patient is 29 y.o. G2P0100 [redacted]w[redacted]d admitted SOL   Delivery Note At 2:49 PM a viable female was delivered via Vaginal, Spontaneous Delivery (Presentation: Right Occiput Anterior).  APGAR: 8,9; weight pending.  Infant dried and placed on mother's abdomen.  Cord clamped and cut by FOB.  Hospital cord blood sample collected.  Placenta delivered, fundal massage applied, and vagina inspected for lacerations.   A hemostatic, mild periurethral appreciated.  Placenta status: intact. Spontaneous.  Cord: 3 vessels with the following complications: None.    Delivery of placenta was done by Dorina Hoyer, MS3.  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    Daiva Nakayama, CNM gloved and actively participated in the entirety of the delivery.  Anesthesia: Epidural  Episiotomy: None Lacerations: Periurethral Suture Repair: hemostatic and did not require repair Est. Blood Loss (mL): 100cc  Mom to postpartum.  Baby to Couplet care / Skin to Skin.  Ronnie Doss M, DO 04/05/2015, 3:06 PM

## 2014-12-03 ENCOUNTER — Ambulatory Visit (INDEPENDENT_AMBULATORY_CARE_PROVIDER_SITE_OTHER): Payer: 59 | Admitting: Obstetrics and Gynecology

## 2014-12-03 ENCOUNTER — Encounter: Payer: Self-pay | Admitting: Obstetrics and Gynecology

## 2014-12-03 VITALS — BP 127/80 | HR 101 | Wt 182.0 lb

## 2014-12-03 DIAGNOSIS — O09892 Supervision of other high risk pregnancies, second trimester: Secondary | ICD-10-CM

## 2014-12-03 DIAGNOSIS — O09292 Supervision of pregnancy with other poor reproductive or obstetric history, second trimester: Secondary | ICD-10-CM

## 2014-12-03 NOTE — Progress Notes (Signed)
Patient is doing well without complaints. Results of the ultrasound reviewed and explained along with AFP results.

## 2014-12-31 ENCOUNTER — Encounter: Payer: Self-pay | Admitting: Obstetrics & Gynecology

## 2015-01-02 ENCOUNTER — Encounter: Payer: Self-pay | Admitting: Family Medicine

## 2015-01-05 ENCOUNTER — Encounter (HOSPITAL_COMMUNITY): Payer: Self-pay

## 2015-01-05 ENCOUNTER — Other Ambulatory Visit (HOSPITAL_COMMUNITY): Payer: Self-pay | Admitting: Maternal and Fetal Medicine

## 2015-01-05 ENCOUNTER — Ambulatory Visit (HOSPITAL_COMMUNITY)
Admission: RE | Admit: 2015-01-05 | Discharge: 2015-01-05 | Disposition: A | Discharge status: Home / Self Care | Payer: 59 | Source: Ambulatory Visit | Attending: Family Medicine | Admitting: Family Medicine

## 2015-01-05 DIAGNOSIS — O09291 Supervision of pregnancy with other poor reproductive or obstetric history, first trimester: Secondary | ICD-10-CM | POA: Insufficient documentation

## 2015-01-05 DIAGNOSIS — Z3A25 25 weeks gestation of pregnancy: Secondary | ICD-10-CM | POA: Insufficient documentation

## 2015-01-23 ENCOUNTER — Ambulatory Visit (INDEPENDENT_AMBULATORY_CARE_PROVIDER_SITE_OTHER): Payer: 59 | Admitting: Obstetrics & Gynecology

## 2015-01-23 VITALS — BP 119/79 | HR 92 | Wt 188.0 lb

## 2015-01-23 DIAGNOSIS — Z23 Encounter for immunization: Secondary | ICD-10-CM

## 2015-01-23 DIAGNOSIS — Z3483 Encounter for supervision of other normal pregnancy, third trimester: Secondary | ICD-10-CM

## 2015-01-23 DIAGNOSIS — Z36 Encounter for antenatal screening of mother: Secondary | ICD-10-CM

## 2015-01-23 DIAGNOSIS — O09293 Supervision of pregnancy with other poor reproductive or obstetric history, third trimester: Secondary | ICD-10-CM

## 2015-01-23 LAB — OB RESULTS CONSOLE HIV ANTIBODY (ROUTINE TESTING): HIV: NONREACTIVE

## 2015-01-23 NOTE — Progress Notes (Signed)
1 hr GTT, third trimester labs, TDap today. No other complaints or concerns.  Labor and fetal movement precautions reviewed.

## 2015-01-23 NOTE — Patient Instructions (Signed)
Return to clinic for any obstetric concerns or go to [facility] for evaluation  

## 2015-01-25 LAB — CBC
HEMATOCRIT: 36.7 % (ref 34.0–46.6)
HEMOGLOBIN: 11.8 g/dL (ref 11.1–15.9)
MCH: 28.2 pg (ref 26.6–33.0)
MCHC: 32.2 g/dL (ref 31.5–35.7)
MCV: 88 fL (ref 79–97)
PLATELETS: 281 10*3/uL (ref 150–379)
RBC: 4.19 x10E6/uL (ref 3.77–5.28)
RDW: 14.5 % (ref 12.3–15.4)
WBC: 10.8 10*3/uL (ref 3.4–10.8)

## 2015-01-25 LAB — RPR: RPR: NONREACTIVE

## 2015-01-26 LAB — HIV ANTIBODY (ROUTINE TESTING W REFLEX): HIV Screen 4th Generation wRfx: NONREACTIVE

## 2015-01-28 LAB — GLUCOSE TOLERANCE, 1 HOUR: Glucose, 1Hr PP: 119 mg/dL (ref 65–199)

## 2015-02-06 ENCOUNTER — Encounter: Payer: Self-pay | Admitting: Family Medicine

## 2015-02-06 ENCOUNTER — Ambulatory Visit (INDEPENDENT_AMBULATORY_CARE_PROVIDER_SITE_OTHER): Payer: 59 | Admitting: Family Medicine

## 2015-02-06 VITALS — BP 110/71 | HR 116 | Wt 190.4 lb

## 2015-02-06 DIAGNOSIS — Z3483 Encounter for supervision of other normal pregnancy, third trimester: Secondary | ICD-10-CM

## 2015-02-06 NOTE — Progress Notes (Signed)
Normal 1 hour Discussed PTL Good fetal movement

## 2015-02-06 NOTE — Patient Instructions (Signed)
Third Trimester of Pregnancy The third trimester is from week 29 through week 42, months 7 through 9. The third trimester is a time when the fetus is growing rapidly. At the end of the ninth month, the fetus is about 20 inches in length and weighs 6-10 pounds.  BODY CHANGES Your body goes through many changes during pregnancy. The changes vary from woman to woman.   Your weight will continue to increase. You can expect to gain 25-35 pounds (11-16 kg) by the end of the pregnancy.  You may begin to get stretch marks on your hips, abdomen, and breasts.  You may urinate more often because the fetus is moving lower into your pelvis and pressing on your bladder.  You may develop or continue to have heartburn as a result of your pregnancy.  You may develop constipation because certain hormones are causing the muscles that push waste through your intestines to slow down.  You may develop hemorrhoids or swollen, bulging veins (varicose veins).  You may have pelvic pain because of the weight gain and pregnancy hormones relaxing your joints between the bones in your pelvis. Backaches may result from overexertion of the muscles supporting your posture.  You may have changes in your hair. These can include thickening of your hair, rapid growth, and changes in texture. Some women also have hair loss during or after pregnancy, or hair that feels dry or thin. Your hair will most likely return to normal after your baby is born.  Your breasts will continue to grow and be tender. A yellow discharge may leak from your breasts called colostrum.  Your belly button may stick out.  You may feel short of breath because of your expanding uterus.  You may notice the fetus "dropping," or moving lower in your abdomen.  You may have a bloody mucus discharge. This usually occurs a few days to a week before labor begins.  Your cervix becomes thin and soft (effaced) near your due date. WHAT TO EXPECT AT YOUR  PRENATAL EXAMS  You will have prenatal exams every 2 weeks until week 36. Then, you will have weekly prenatal exams. During a routine prenatal visit:  You will be weighed to make sure you and the fetus are growing normally.  Your blood pressure is taken.  Your abdomen will be measured to track your baby's growth.  The fetal heartbeat will be listened to.  Any test results from the previous visit will be discussed.  You may have a cervical check near your due date to see if you have effaced. At around 36 weeks, your caregiver will check your cervix. At the same time, your caregiver will also perform a test on the secretions of the vaginal tissue. This test is to determine if a type of bacteria, Group B streptococcus, is present. Your caregiver will explain this further. Your caregiver may ask you:  What your birth plan is.  How you are feeling.  If you are feeling the baby move.  If you have had any abnormal symptoms, such as leaking fluid, bleeding, severe headaches, or abdominal cramping.  If you have any questions. Other tests or screenings that may be performed during your third trimester include:  Blood tests that check for low iron levels (anemia).  Fetal testing to check the health, activity level, and growth of the fetus. Testing is done if you have certain medical conditions or if there are problems during the pregnancy. FALSE LABOR You may feel small, irregular contractions that   eventually go away. These are called Braxton Hicks contractions, or false labor. Contractions may last for hours, days, or even weeks before true labor sets in. If contractions come at regular intervals, intensify, or become painful, it is best to be seen by your caregiver.  SIGNS OF LABOR   Menstrual-like cramps.  Contractions that are 5 minutes apart or less.  Contractions that start on the top of the uterus and spread down to the lower abdomen and back.  A sense of increased pelvic  pressure or back pain.  A watery or bloody mucus discharge that comes from the vagina. If you have any of these signs before the 37th week of pregnancy, call your caregiver right away. You need to go to the hospital to get checked immediately. HOME CARE INSTRUCTIONS   Avoid all smoking, herbs, alcohol, and unprescribed drugs. These chemicals affect the formation and growth of the baby.  Follow your caregiver's instructions regarding medicine use. There are medicines that are either safe or unsafe to take during pregnancy.  Exercise only as directed by your caregiver. Experiencing uterine cramps is a good sign to stop exercising.  Continue to eat regular, healthy meals.  Wear a good support bra for breast tenderness.  Do not use hot tubs, steam rooms, or saunas.  Wear your seat belt at all times when driving.  Avoid raw meat, uncooked cheese, cat litter boxes, and soil used by cats. These carry germs that can cause birth defects in the baby.  Take your prenatal vitamins.  Try taking a stool softener (if your caregiver approves) if you develop constipation. Eat more high-fiber foods, such as fresh vegetables or fruit and whole grains. Drink plenty of fluids to keep your urine clear or pale yellow.  Take warm sitz baths to soothe any pain or discomfort caused by hemorrhoids. Use hemorrhoid cream if your caregiver approves.  If you develop varicose veins, wear support hose. Elevate your feet for 15 minutes, 3-4 times a day. Limit salt in your diet.  Avoid heavy lifting, wear low heal shoes, and practice good posture.  Rest a lot with your legs elevated if you have leg cramps or low back pain.  Visit your dentist if you have not gone during your pregnancy. Use a soft toothbrush to brush your teeth and be gentle when you floss.  A sexual relationship may be continued unless your caregiver directs you otherwise.  Do not travel far distances unless it is absolutely necessary and only  with the approval of your caregiver.  Take prenatal classes to understand, practice, and ask questions about the labor and delivery.  Make a trial run to the hospital.  Pack your hospital bag.  Prepare the baby's nursery.  Continue to go to all your prenatal visits as directed by your caregiver. SEEK MEDICAL CARE IF:  You are unsure if you are in labor or if your water has broken.  You have dizziness.  You have mild pelvic cramps, pelvic pressure, or nagging pain in your abdominal area.  You have persistent nausea, vomiting, or diarrhea.  You have a bad smelling vaginal discharge.  You have pain with urination. SEEK IMMEDIATE MEDICAL CARE IF:   You have a fever.  You are leaking fluid from your vagina.  You have spotting or bleeding from your vagina.  You have severe abdominal cramping or pain.  You have rapid weight loss or gain.  You have shortness of breath with chest pain.  You notice sudden or extreme swelling   of your face, hands, ankles, feet, or legs.  You have not felt your baby move in over an hour.  You have severe headaches that do not go away with medicine.  You have vision changes. Document Released: 11/08/2001 Document Revised: 11/19/2013 Document Reviewed: 01/15/2013 [employer] Patient Information 2015 [employer]. This information is not intended to replace advice given to you by your health care provider. Make sure you discuss any questions you have with your health care provider.  Breastfeeding Deciding to breastfeed is one of the best choices you can make for you and your baby. A change in hormones during pregnancy causes your breast tissue to grow and increases the number and size of your milk ducts. These hormones also allow proteins, sugars, and fats from your blood supply to make breast milk in your milk-producing glands. Hormones prevent breast milk from being released before your baby is born as well as prompt milk flow after birth. Once  breastfeeding has begun, thoughts of your baby, as well as his or her sucking or crying, can stimulate the release of milk from your milk-producing glands.  BENEFITS OF BREASTFEEDING For Your Baby  Your first milk (colostrum) helps your baby's digestive system function better.   There are antibodies in your milk that help your baby fight off infections.   Your baby has a lower incidence of asthma, allergies, and sudden infant death syndrome.   The nutrients in breast milk are better for your baby than infant formulas and are designed uniquely for your baby's needs.   Breast milk improves your baby's brain development.   Your baby is less likely to develop other conditions, such as childhood obesity, asthma, or type 2 diabetes mellitus.  For You   Breastfeeding helps to create a very special bond between you and your baby.   Breastfeeding is convenient. Breast milk is always available at the correct temperature and costs nothing.   Breastfeeding helps to burn calories and helps you lose the weight gained during pregnancy.   Breastfeeding makes your uterus contract to its prepregnancy size faster and slows bleeding (lochia) after you give birth.   Breastfeeding helps to lower your risk of developing type 2 diabetes mellitus, osteoporosis, and breast or ovarian cancer later in life. SIGNS THAT YOUR BABY IS HUNGRY Early Signs of Hunger  Increased alertness or activity.  Stretching.  Movement of the head from side to side.  Movement of the head and opening of the mouth when the corner of the mouth or cheek is stroked (rooting).  Increased sucking sounds, smacking lips, cooing, sighing, or squeaking.  Hand-to-mouth movements.  Increased sucking of fingers or hands. Late Signs of Hunger  Fussing.  Intermittent crying. Extreme Signs of Hunger Signs of extreme hunger will require calming and consoling before your baby will be able to breastfeed successfully. Do not  wait for the following signs of extreme hunger to occur before you initiate breastfeeding:   Restlessness.  A loud, strong cry.   Screaming. BREASTFEEDING BASICS Breastfeeding Initiation  Find a comfortable place to sit or lie down, with your neck and back well supported.  Place a pillow or rolled up blanket under your baby to bring him or her to the level of your breast (if you are seated). Nursing pillows are specially designed to help support your arms and your baby while you breastfeed.  Make sure that your baby's abdomen is facing your abdomen.   Gently massage your breast. With your fingertips, massage from your chest   wall toward your nipple in a circular motion. This encourages milk flow. You may need to continue this action during the feeding if your milk flows slowly.  Support your breast with 4 fingers underneath and your thumb above your nipple. Make sure your fingers are well away from your nipple and your baby's mouth.   Stroke your baby's lips gently with your finger or nipple.   When your baby's mouth is open wide enough, quickly bring your baby to your breast, placing your entire nipple and as much of the colored area around your nipple (areola) as possible into your baby's mouth.   More areola should be visible above your baby's upper lip than below the lower lip.   Your baby's tongue should be between his or her lower gum and your breast.   Ensure that your baby's mouth is correctly positioned around your nipple (latched). Your baby's lips should create a seal on your breast and be turned out (everted).  It is common for your baby to suck about 2-3 minutes in order to start the flow of breast milk. Latching Teaching your baby how to latch on to your breast properly is very important. An improper latch can cause nipple pain and decreased milk supply for you and poor weight gain in your baby. Also, if your baby is not latched onto your nipple properly, he or she  may swallow some air during feeding. This can make your baby fussy. Burping your baby when you switch breasts during the feeding can help to get rid of the air. However, teaching your baby to latch on properly is still the best way to prevent fussiness from swallowing air while breastfeeding. Signs that your baby has successfully latched on to your nipple:    Silent tugging or silent sucking, without causing you pain.   Swallowing heard between every 3-4 sucks.    Muscle movement above and in front of his or her ears while sucking.  Signs that your baby has not successfully latched on to nipple:   Sucking sounds or smacking sounds from your baby while breastfeeding.  Nipple pain. If you think your baby has not latched on correctly, slip your finger into the corner of your baby's mouth to break the suction and place it between your baby's gums. Attempt breastfeeding initiation again. Signs of Successful Breastfeeding Signs from your baby:   A gradual decrease in the number of sucks or complete cessation of sucking.   Falling asleep.   Relaxation of his or her body.   Retention of a small amount of milk in his or her mouth.   Letting go of your breast by himself or herself. Signs from you:  Breasts that have increased in firmness, weight, and size 1-3 hours after feeding.   Breasts that are softer immediately after breastfeeding.  Increased milk volume, as well as a change in milk consistency and color by the fifth day of breastfeeding.   Nipples that are not sore, cracked, or bleeding. Signs That Your Baby is Getting Enough Milk  Wetting at least 3 diapers in a 24-hour period. The urine should be clear and pale yellow by age 5 days.  At least 3 stools in a 24-hour period by age 5 days. The stool should be soft and yellow.  At least 3 stools in a 24-hour period by age 7 days. The stool should be seedy and yellow.  No loss of weight greater than 10% of birth weight  during the first 3   days of age.  Average weight gain of 4-7 ounces (113-198 g) per week after age 4 days.  Consistent daily weight gain by age 5 days, without weight loss after the age of 2 weeks. After a feeding, your baby may spit up a small amount. This is common. BREASTFEEDING FREQUENCY AND DURATION Frequent feeding will help you make more milk and can prevent sore nipples and breast engorgement. Breastfeed when you feel the need to reduce the fullness of your breasts or when your baby shows signs of hunger. This is called "breastfeeding on demand." Avoid introducing a pacifier to your baby while you are working to establish breastfeeding (the first 4-6 weeks after your baby is born). After this time you may choose to use a pacifier. Research has shown that pacifier use during the first year of a baby's life decreases the risk of sudden infant death syndrome (SIDS). Allow your baby to feed on each breast as long as he or she wants. Breastfeed until your baby is finished feeding. When your baby unlatches or falls asleep while feeding from the first breast, offer the second breast. Because newborns are often sleepy in the first few weeks of life, you may need to awaken your baby to get him or her to feed. Breastfeeding times will vary from baby to baby. However, the following rules can serve as a guide to help you ensure that your baby is properly fed:  Newborns (babies 4 weeks of age or younger) may breastfeed every 1-3 hours.  Newborns should not go longer than 3 hours during the day or 5 hours during the night without breastfeeding.  You should breastfeed your baby a minimum of 8 times in a 24-hour period until you begin to introduce solid foods to your baby at around 6 months of age. BREAST MILK PUMPING Pumping and storing breast milk allows you to ensure that your baby is exclusively fed your breast milk, even at times when you are unable to breastfeed. This is especially important if you are  going back to work while you are still breastfeeding or when you are not able to be present during feedings. Your lactation consultant can give you guidelines on how long it is safe to store breast milk.  A breast pump is a machine that allows you to pump milk from your breast into a sterile bottle. The pumped breast milk can then be stored in a refrigerator or freezer. Some breast pumps are operated by hand, while others use electricity. Ask your lactation consultant which type will work best for you. Breast pumps can be purchased, but some hospitals and breastfeeding support groups lease breast pumps on a monthly basis. A lactation consultant can teach you how to hand express breast milk, if you prefer not to use a pump.  CARING FOR YOUR BREASTS WHILE YOU BREASTFEED Nipples can become dry, cracked, and sore while breastfeeding. The following recommendations can help keep your breasts moisturized and healthy:  Avoid using soap on your nipples.   Wear a supportive bra. Although not required, special nursing bras and tank tops are designed to allow access to your breasts for breastfeeding without taking off your entire bra or top. Avoid wearing underwire-style bras or extremely tight bras.  Air dry your nipples for 3-4minutes after each feeding.   Use only cotton bra pads to absorb leaked breast milk. Leaking of breast milk between feedings is normal.   Use lanolin on your nipples after breastfeeding. Lanolin helps to maintain your skin's   normal moisture barrier. If you use pure lanolin, you do not need to wash it off before feeding your baby again. Pure lanolin is not toxic to your baby. You may also hand express a few drops of breast milk and gently massage that milk into your nipples and allow the milk to air dry. In the first few weeks after giving birth, some women experience extremely full breasts (engorgement). Engorgement can make your breasts feel heavy, warm, and tender to the touch.  Engorgement peaks within 3-5 days after you give birth. The following recommendations can help ease engorgement:  Completely empty your breasts while breastfeeding or pumping. You may want to start by applying warm, moist heat (in the shower or with warm water-soaked hand towels) just before feeding or pumping. This increases circulation and helps the milk flow. If your baby does not completely empty your breasts while breastfeeding, pump any extra milk after he or she is finished.  Wear a snug bra (nursing or regular) or tank top for 1-2 days to signal your body to slightly decrease milk production.  Apply ice packs to your breasts, unless this is too uncomfortable for you.  Make sure that your baby is latched on and positioned properly while breastfeeding. If engorgement persists after 48 hours of following these recommendations, contact your health care provider or a lactation consultant. OVERALL HEALTH CARE RECOMMENDATIONS WHILE BREASTFEEDING  Eat healthy foods. Alternate between meals and snacks, eating 3 of each per day. Because what you eat affects your breast milk, some of the foods may make your baby more irritable than usual. Avoid eating these foods if you are sure that they are negatively affecting your baby.  Drink milk, fruit juice, and water to satisfy your thirst (about 10 glasses a day).   Rest often, relax, and continue to take your prenatal vitamins to prevent fatigue, stress, and anemia.  Continue breast self-awareness checks.  Avoid chewing and smoking tobacco.  Avoid alcohol and drug use. Some medicines that may be harmful to your baby can pass through breast milk. It is important to ask your health care provider before taking any medicine, including all over-the-counter and prescription medicine as well as vitamin and herbal supplements. It is possible to become pregnant while breastfeeding. If birth control is desired, ask your health care provider about options that  will be safe for your baby. SEEK MEDICAL CARE IF:   You feel like you want to stop breastfeeding or have become frustrated with breastfeeding.  You have painful breasts or nipples.  Your nipples are cracked or bleeding.  Your breasts are red, tender, or warm.  You have a swollen area on either breast.  You have a fever or chills.  You have nausea or vomiting.  You have drainage other than breast milk from your nipples.  Your breasts do not become full before feedings by the fifth day after you give birth.  You feel sad and depressed.  Your baby is too sleepy to eat well.  Your baby is having trouble sleeping.   Your baby is wetting less than 3 diapers in a 24-hour period.  Your baby has less than 3 stools in a 24-hour period.  Your baby's skin or the white part of his or her eyes becomes yellow.   Your baby is not gaining weight by 5 days of age. SEEK IMMEDIATE MEDICAL CARE IF:   Your baby is overly tired (lethargic) and does not want to wake up and feed.  Your baby   develops an unexplained fever. Document Released: 11/14/2005 Document Revised: 11/19/2013 Document Reviewed: 05/08/2013 [employer] Patient Information 2015 [employer]. This information is not intended to replace advice given to you by your health care provider. Make sure you discuss any questions you have with your health care provider.  

## 2015-02-16 ENCOUNTER — Ambulatory Visit (HOSPITAL_COMMUNITY)
Admission: RE | Admit: 2015-02-16 | Discharge: 2015-02-16 | Disposition: A | Discharge status: Home / Self Care | Payer: 59 | Source: Ambulatory Visit | Attending: Family Medicine | Admitting: Family Medicine

## 2015-02-16 ENCOUNTER — Encounter (HOSPITAL_COMMUNITY): Payer: Self-pay

## 2015-02-16 ENCOUNTER — Other Ambulatory Visit (HOSPITAL_COMMUNITY): Payer: Self-pay | Admitting: Maternal and Fetal Medicine

## 2015-02-16 DIAGNOSIS — Z36 Encounter for antenatal screening of mother: Secondary | ICD-10-CM | POA: Diagnosis present

## 2015-02-16 DIAGNOSIS — O09291 Supervision of pregnancy with other poor reproductive or obstetric history, first trimester: Secondary | ICD-10-CM

## 2015-02-16 DIAGNOSIS — O09293 Supervision of pregnancy with other poor reproductive or obstetric history, third trimester: Secondary | ICD-10-CM | POA: Diagnosis not present

## 2015-02-16 DIAGNOSIS — Z3A31 31 weeks gestation of pregnancy: Secondary | ICD-10-CM | POA: Diagnosis not present

## 2015-02-16 DIAGNOSIS — Z3A35 35 weeks gestation of pregnancy: Secondary | ICD-10-CM

## 2015-02-19 ENCOUNTER — Ambulatory Visit (INDEPENDENT_AMBULATORY_CARE_PROVIDER_SITE_OTHER): Payer: 59 | Admitting: Obstetrics & Gynecology

## 2015-02-19 VITALS — BP 112/72 | HR 94 | Wt 190.0 lb

## 2015-02-19 DIAGNOSIS — Z3483 Encounter for supervision of other normal pregnancy, third trimester: Secondary | ICD-10-CM

## 2015-02-19 DIAGNOSIS — O09293 Supervision of pregnancy with other poor reproductive or obstetric history, third trimester: Secondary | ICD-10-CM

## 2015-02-19 NOTE — Patient Instructions (Signed)
Return to clinic for any obstetric concerns or go to [facility] for evaluation  

## 2015-02-19 NOTE — Progress Notes (Signed)
Normal growth scan this week, rescan scheduled in 4 weeks.  No other complaints or concerns.  Labor and fetal movement precautions reviewed.

## 2015-03-06 ENCOUNTER — Ambulatory Visit (INDEPENDENT_AMBULATORY_CARE_PROVIDER_SITE_OTHER): Payer: 59 | Admitting: Obstetrics and Gynecology

## 2015-03-06 ENCOUNTER — Encounter: Payer: Self-pay | Admitting: Obstetrics and Gynecology

## 2015-03-06 VITALS — BP 107/74 | HR 83 | Wt 190.0 lb

## 2015-03-06 DIAGNOSIS — Z3483 Encounter for supervision of other normal pregnancy, third trimester: Secondary | ICD-10-CM

## 2015-03-06 DIAGNOSIS — O09293 Supervision of pregnancy with other poor reproductive or obstetric history, third trimester: Secondary | ICD-10-CM

## 2015-03-06 NOTE — Progress Notes (Signed)
Patient is doing well without complaints. FM/PTL precautions reviewed. Cultures next visit

## 2015-03-20 ENCOUNTER — Encounter: Payer: Self-pay | Admitting: *Deleted

## 2015-03-20 ENCOUNTER — Encounter (HOSPITAL_COMMUNITY): Payer: Self-pay

## 2015-03-20 ENCOUNTER — Other Ambulatory Visit (HOSPITAL_COMMUNITY): Payer: Self-pay | Admitting: Obstetrics and Gynecology

## 2015-03-20 ENCOUNTER — Ambulatory Visit (HOSPITAL_COMMUNITY)
Admission: RE | Admit: 2015-03-20 | Discharge: 2015-03-20 | Disposition: A | Discharge status: Home / Self Care | Payer: 59 | Source: Ambulatory Visit | Attending: Family Medicine | Admitting: Family Medicine

## 2015-03-20 ENCOUNTER — Ambulatory Visit (INDEPENDENT_AMBULATORY_CARE_PROVIDER_SITE_OTHER): Payer: 59 | Admitting: Family Medicine

## 2015-03-20 VITALS — BP 116/80 | HR 102 | Wt 192.0 lb

## 2015-03-20 VITALS — BP 112/78 | Wt 190.0 lb

## 2015-03-20 DIAGNOSIS — O09292 Supervision of pregnancy with other poor reproductive or obstetric history, second trimester: Secondary | ICD-10-CM

## 2015-03-20 DIAGNOSIS — Z36 Encounter for antenatal screening of mother: Secondary | ICD-10-CM

## 2015-03-20 DIAGNOSIS — Z3483 Encounter for supervision of other normal pregnancy, third trimester: Secondary | ICD-10-CM

## 2015-03-20 DIAGNOSIS — Z3A36 36 weeks gestation of pregnancy: Secondary | ICD-10-CM | POA: Insufficient documentation

## 2015-03-20 DIAGNOSIS — O288 Other abnormal findings on antenatal screening of mother: Secondary | ICD-10-CM | POA: Insufficient documentation

## 2015-03-20 DIAGNOSIS — O09293 Supervision of pregnancy with other poor reproductive or obstetric history, third trimester: Secondary | ICD-10-CM | POA: Insufficient documentation

## 2015-03-20 DIAGNOSIS — Z3A35 35 weeks gestation of pregnancy: Secondary | ICD-10-CM

## 2015-03-20 DIAGNOSIS — Z3493 Encounter for supervision of normal pregnancy, unspecified, third trimester: Secondary | ICD-10-CM

## 2015-03-20 DIAGNOSIS — O99210 Obesity complicating pregnancy, unspecified trimester: Secondary | ICD-10-CM

## 2015-03-20 LAB — OB RESULTS CONSOLE GC/CHLAMYDIA
Chlamydia: NEGATIVE
GC PROBE AMP, GENITAL: NEGATIVE

## 2015-03-20 LAB — OB RESULTS CONSOLE GBS: STREP GROUP B AG: NEGATIVE

## 2015-03-20 NOTE — Patient Instructions (Signed)
Breastfeeding Deciding to breastfeed is one of the best choices you can make for you and your baby. A change in hormones during pregnancy causes your breast tissue to grow and increases the number and size of your milk ducts. These hormones also allow proteins, sugars, and fats from your blood supply to make breast milk in your milk-producing glands. Hormones prevent breast milk from being released before your baby is born as well as prompt milk flow after birth. Once breastfeeding has begun, thoughts of your baby, as well as his or her sucking or crying, can stimulate the release of milk from your milk-producing glands.  BENEFITS OF BREASTFEEDING For Your Baby  Your first milk (colostrum) helps your baby's digestive system function better.   There are antibodies in your milk that help your baby fight off infections.   Your baby has a lower incidence of asthma, allergies, and sudden infant death syndrome.   The nutrients in breast milk are better for your baby than infant formulas and are designed uniquely for your baby's needs.   Breast milk improves your baby's brain development.   Your baby is less likely to develop other conditions, such as childhood obesity, asthma, or type 2 diabetes mellitus.  For You   Breastfeeding helps to create a very special bond between you and your baby.   Breastfeeding is convenient. Breast milk is always available at the correct temperature and costs nothing.   Breastfeeding helps to burn calories and helps you lose the weight gained during pregnancy.   Breastfeeding makes your uterus contract to its prepregnancy size faster and slows bleeding (lochia) after you give birth.   Breastfeeding helps to lower your risk of developing type 2 diabetes mellitus, osteoporosis, and breast or ovarian cancer later in life. SIGNS THAT YOUR BABY IS HUNGRY Early Signs of Hunger  Increased alertness or activity.  Stretching.  Movement of the head from  side to side.  Movement of the head and opening of the mouth when the corner of the mouth or cheek is stroked (rooting).  Increased sucking sounds, smacking lips, cooing, sighing, or squeaking.  Hand-to-mouth movements.  Increased sucking of fingers or hands. Late Signs of Hunger  Fussing.  Intermittent crying. Extreme Signs of Hunger Signs of extreme hunger will require calming and consoling before your baby will be able to breastfeed successfully. Do not wait for the following signs of extreme hunger to occur before you initiate breastfeeding:   Restlessness.  A loud, strong cry.   Screaming. BREASTFEEDING BASICS Breastfeeding Initiation  Find a comfortable place to sit or lie down, with your neck and back well supported.  Place a pillow or rolled up blanket under your baby to bring him or her to the level of your breast (if you are seated). Nursing pillows are specially designed to help support your arms and your baby while you breastfeed.  Make sure that your baby's abdomen is facing your abdomen.   Gently massage your breast. With your fingertips, massage from your chest wall toward your nipple in a circular motion. This encourages milk flow. You may need to continue this action during the feeding if your milk flows slowly.  Support your breast with 4 fingers underneath and your thumb above your nipple. Make sure your fingers are well away from your nipple and your baby's mouth.   Stroke your baby's lips gently with your finger or nipple.   When your baby's mouth is open wide enough, quickly bring your baby to your  breast, placing your entire nipple and as much of the colored area around your nipple (areola) as possible into your baby's mouth.   More areola should be visible above your baby's upper lip than below the lower lip.   Your baby's tongue should be between his or her lower gum and your breast.   Ensure that your baby's mouth is correctly positioned  around your nipple (latched). Your baby's lips should create a seal on your breast and be turned out (everted).  It is common for your baby to suck about 2-3 minutes in order to start the flow of breast milk. Latching Teaching your baby how to latch on to your breast properly is very important. An improper latch can cause nipple pain and decreased milk supply for you and poor weight gain in your baby. Also, if your baby is not latched onto your nipple properly, he or she may swallow some air during feeding. This can make your baby fussy. Burping your baby when you switch breasts during the feeding can help to get rid of the air. However, teaching your baby to latch on properly is still the best way to prevent fussiness from swallowing air while breastfeeding. Signs that your baby has successfully latched on to your nipple:    Silent tugging or silent sucking, without causing you pain.   Swallowing heard between every 3-4 sucks.    Muscle movement above and in front of his or her ears while sucking.  Signs that your baby has not successfully latched on to nipple:   Sucking sounds or smacking sounds from your baby while breastfeeding.  Nipple pain. If you think your baby has not latched on correctly, slip your finger into the corner of your baby's mouth to break the suction and place it between your baby's gums. Attempt breastfeeding initiation again. Signs of Successful Breastfeeding Signs from your baby:   A gradual decrease in the number of sucks or complete cessation of sucking.   Falling asleep.   Relaxation of his or her body.   Retention of a small amount of milk in his or her mouth.   Letting go of your breast by himself or herself. Signs from you:  Breasts that have increased in firmness, weight, and size 1-3 hours after feeding.   Breasts that are softer immediately after breastfeeding.  Increased milk volume, as well as a change in milk consistency and color by  the fifth day of breastfeeding.   Nipples that are not sore, cracked, or bleeding. Signs That Your [employer] is Getting Enough Milk  Wetting at least 3 diapers in a 24-hour period. The urine should be clear and pale yellow by age 11 days.  At least 3 stools in a 24-hour period by age 11 days. The stool should be soft and yellow.  At least 3 stools in a 24-hour period by age 18 days. The stool should be seedy and yellow.  No loss of weight greater than 10% of birth weight during the first 55 days of age.  Average weight gain of 4-7 ounces (113-198 g) per week after age 36 days.  Consistent daily weight gain by age 9 days, without weight loss after the age of 2 weeks. After a feeding, your baby may spit up a small amount. This is common. BREASTFEEDING FREQUENCY AND DURATION Frequent feeding will help you make more milk and can prevent sore nipples and breast engorgement. Breastfeed when you feel the need to reduce the fullness of your breasts  or when your baby shows signs of hunger. This is called "breastfeeding on demand." Avoid introducing a pacifier to your baby while you are working to establish breastfeeding (the first 4-6 weeks after your baby is born). After this time you may choose to use a pacifier. Research has shown that pacifier use during the first year of a baby's life decreases the risk of sudden infant death syndrome (SIDS). Allow your baby to feed on each breast as long as he or she wants. Breastfeed until your baby is finished feeding. When your baby unlatches or falls asleep while feeding from the first breast, offer the second breast. Because newborns are often sleepy in the first few weeks of life, you may need to awaken your baby to get him or her to feed. Breastfeeding times will vary from baby to baby. However, the following rules can serve as a guide to help you ensure that your baby is properly fed:  Newborns (babies 5 weeks of age or younger) may breastfeed every 1-3  hours.  Newborns should not go longer than 3 hours during the day or 5 hours during the night without breastfeeding.  You should breastfeed your baby a minimum of 8 times in a 24-hour period until you begin to introduce solid foods to your baby at around 54 months of age. BREAST MILK PUMPING Pumping and storing breast milk allows you to ensure that your baby is exclusively fed your breast milk, even at times when you are unable to breastfeed. This is especially important if you are going back to work while you are still breastfeeding or when you are not able to be present during feedings. Your lactation consultant can give you guidelines on how long it is safe to store breast milk.  A breast pump is a machine that allows you to pump milk from your breast into a sterile bottle. The pumped breast milk can then be stored in a refrigerator or freezer. Some breast pumps are operated by hand, while others use electricity. Ask your lactation consultant which type will work best for you. Breast pumps can be purchased, but some hospitals and breastfeeding support groups lease breast pumps on a monthly basis. A lactation consultant can teach you how to hand express breast milk, if you prefer not to use a pump.  CARING FOR YOUR BREASTS WHILE YOU BREASTFEED Nipples can become dry, cracked, and sore while breastfeeding. The following recommendations can help keep your breasts moisturized and healthy:  Avoid using soap on your nipples.   Wear a supportive bra. Although not required, special nursing bras and tank tops are designed to allow access to your breasts for breastfeeding without taking off your entire bra or top. Avoid wearing underwire-style bras or extremely tight bras.  Air dry your nipples for 3-98minutes after each feeding.   Use only cotton bra pads to absorb leaked breast milk. Leaking of breast milk between feedings is normal.   Use lanolin on your nipples after breastfeeding. Lanolin helps to  maintain your skin's normal moisture barrier. If you use pure lanolin, you do not need to wash it off before feeding your baby again. Pure lanolin is not toxic to your baby. You may also hand express a few drops of breast milk and gently massage that milk into your nipples and allow the milk to air dry. In the first few weeks after giving birth, some women experience extremely full breasts (engorgement). Engorgement can make your breasts feel heavy, warm, and tender to the  touch. Engorgement peaks within 3-5 days after you give birth. The following recommendations can help ease engorgement:  Completely empty your breasts while breastfeeding or pumping. You may want to start by applying warm, moist heat (in the shower or with warm water-soaked hand towels) just before feeding or pumping. This increases circulation and helps the milk flow. If your baby does not completely empty your breasts while breastfeeding, pump any extra milk after he or she is finished.  Wear a snug bra (nursing or regular) or tank top for 1-2 days to signal your body to slightly decrease milk production.  Apply ice packs to your breasts, unless this is too uncomfortable for you.  Make sure that your baby is latched on and positioned properly while breastfeeding. If engorgement persists after 48 hours of following these recommendations, contact your health care provider or a Science writer. OVERALL HEALTH CARE RECOMMENDATIONS WHILE BREASTFEEDING  Eat healthy foods. Alternate between meals and snacks, eating 3 of each per day. Because what you eat affects your breast milk, some of the foods may make your baby more irritable than usual. Avoid eating these foods if you are sure that they are negatively affecting your baby.  Drink milk, fruit juice, and water to satisfy your thirst (about 10 glasses a day).   Rest often, relax, and continue to take your prenatal vitamins to prevent fatigue, stress, and anemia.  Continue  breast self-awareness checks.  Avoid chewing and smoking tobacco.  Avoid alcohol and drug use. Some medicines that may be harmful to your baby can pass through breast milk. It is important to ask your health care provider before taking any medicine, including all over-the-counter and prescription medicine as well as vitamin and herbal supplements. It is possible to become pregnant while breastfeeding. If birth control is desired, ask your health care provider about options that will be safe for your baby. SEEK MEDICAL CARE IF:   You feel like you want to stop breastfeeding or have become frustrated with breastfeeding.  You have painful breasts or nipples.  Your nipples are cracked or bleeding.  Your breasts are red, tender, or warm.  You have a swollen area on either breast.  You have a fever or chills.  You have nausea or vomiting.  You have drainage other than breast milk from your nipples.  Your breasts do not become full before feedings by the fifth day after you give birth.  You feel sad and depressed.  Your baby is too sleepy to eat well.  Your baby is having trouble sleeping.   Your baby is wetting less than 3 diapers in a 24-hour period.  Your baby has less than 3 stools in a 24-hour period.  Your baby's skin or the white part of his or her eyes becomes yellow.   Your baby is not gaining weight by 40 days of age. SEEK IMMEDIATE MEDICAL CARE IF:   Your baby is overly tired (lethargic) and does not want to wake up and feed.  Your baby develops an unexplained fever. Document Released: 11/14/2005 Document Revised: 11/19/2013 Document Reviewed: 05/08/2013 [facility] Patient Information 2015 [place], [place]. This information is not intended to replace advice given to you by your health care provider. Make sure you discuss any questions you have with your health care provider.

## 2015-03-20 NOTE — Progress Notes (Signed)
Seen by MFM today-- They have recommeded 2x/wk NST's due to prior IUFD--will begin in the office U/s shows normal growth and fluid Cultures today

## 2015-03-24 ENCOUNTER — Ambulatory Visit (HOSPITAL_COMMUNITY): Payer: 59

## 2015-03-24 ENCOUNTER — Ambulatory Visit (HOSPITAL_COMMUNITY)
Admission: RE | Admit: 2015-03-24 | Discharge: 2015-03-24 | Disposition: A | Discharge status: Home / Self Care | Payer: 59 | Source: Ambulatory Visit | Attending: Obstetrics & Gynecology | Admitting: Obstetrics & Gynecology

## 2015-03-24 ENCOUNTER — Ambulatory Visit (INDEPENDENT_AMBULATORY_CARE_PROVIDER_SITE_OTHER): Payer: 59 | Admitting: *Deleted

## 2015-03-24 VITALS — BP 110/75 | HR 109 | Wt 195.0 lb

## 2015-03-24 DIAGNOSIS — O09293 Supervision of pregnancy with other poor reproductive or obstetric history, third trimester: Secondary | ICD-10-CM | POA: Insufficient documentation

## 2015-03-24 DIAGNOSIS — Z3A36 36 weeks gestation of pregnancy: Secondary | ICD-10-CM | POA: Insufficient documentation

## 2015-03-24 DIAGNOSIS — O288 Other abnormal findings on antenatal screening of mother: Secondary | ICD-10-CM | POA: Insufficient documentation

## 2015-03-24 DIAGNOSIS — O283 Abnormal ultrasonic finding on antenatal screening of mother: Secondary | ICD-10-CM | POA: Insufficient documentation

## 2015-03-24 DIAGNOSIS — O09291 Supervision of pregnancy with other poor reproductive or obstetric history, first trimester: Secondary | ICD-10-CM | POA: Diagnosis not present

## 2015-03-24 DIAGNOSIS — O09299 Supervision of pregnancy with other poor reproductive or obstetric history, unspecified trimester: Secondary | ICD-10-CM

## 2015-03-24 LAB — NUSWAB VAGINITIS PLUS (VG+)
CHLAMYDIA TRACHOMATIS, NAA: NEGATIVE
Neisseria gonorrhoeae, NAA: NEGATIVE

## 2015-03-24 LAB — CULTURE, BETA STREP (GROUP B ONLY): Strep Gp B Culture: NEGATIVE

## 2015-03-24 NOTE — Progress Notes (Signed)
NST non reactive in office reviewed by Dr. Hulan Fray.  Patient sent to [facility] for BPP.

## 2015-03-27 ENCOUNTER — Ambulatory Visit (INDEPENDENT_AMBULATORY_CARE_PROVIDER_SITE_OTHER): Payer: 59 | Admitting: Obstetrics and Gynecology

## 2015-03-27 ENCOUNTER — Encounter: Payer: Self-pay | Admitting: Obstetrics and Gynecology

## 2015-03-27 ENCOUNTER — Other Ambulatory Visit (HOSPITAL_COMMUNITY): Payer: Self-pay | Admitting: Maternal and Fetal Medicine

## 2015-03-27 ENCOUNTER — Ambulatory Visit (HOSPITAL_COMMUNITY): Payer: 59

## 2015-03-27 VITALS — BP 130/82 | HR 96 | Wt 196.2 lb

## 2015-03-27 DIAGNOSIS — O09293 Supervision of pregnancy with other poor reproductive or obstetric history, third trimester: Secondary | ICD-10-CM

## 2015-03-27 DIAGNOSIS — O09299 Supervision of pregnancy with other poor reproductive or obstetric history, unspecified trimester: Secondary | ICD-10-CM

## 2015-03-27 DIAGNOSIS — O288 Other abnormal findings on antenatal screening of mother: Secondary | ICD-10-CM

## 2015-03-27 DIAGNOSIS — Z3483 Encounter for supervision of other normal pregnancy, third trimester: Secondary | ICD-10-CM | POA: Diagnosis not present

## 2015-03-27 NOTE — Progress Notes (Signed)
Patient is doing well without complaints. FM/labor precautions reviewed NST reviewed and reactive

## 2015-03-31 ENCOUNTER — Ambulatory Visit (HOSPITAL_COMMUNITY)
Admission: RE | Admit: 2015-03-31 | Discharge: 2015-03-31 | Disposition: A | Discharge status: Home / Self Care | Payer: 59 | Source: Ambulatory Visit | Attending: Family Medicine | Admitting: Family Medicine

## 2015-03-31 ENCOUNTER — Encounter (HOSPITAL_COMMUNITY): Payer: Self-pay

## 2015-03-31 ENCOUNTER — Ambulatory Visit (HOSPITAL_COMMUNITY)
Admission: RE | Admit: 2015-03-31 | Discharge: 2015-03-31 | Disposition: A | Discharge status: Home / Self Care | Payer: 59 | Source: Ambulatory Visit | Attending: Obstetrics and Gynecology | Admitting: Obstetrics and Gynecology

## 2015-03-31 DIAGNOSIS — O288 Other abnormal findings on antenatal screening of mother: Secondary | ICD-10-CM

## 2015-03-31 DIAGNOSIS — Z3A37 37 weeks gestation of pregnancy: Secondary | ICD-10-CM | POA: Diagnosis not present

## 2015-03-31 DIAGNOSIS — O09293 Supervision of pregnancy with other poor reproductive or obstetric history, third trimester: Secondary | ICD-10-CM | POA: Diagnosis not present

## 2015-03-31 DIAGNOSIS — O09299 Supervision of pregnancy with other poor reproductive or obstetric history, unspecified trimester: Secondary | ICD-10-CM

## 2015-04-02 ENCOUNTER — Encounter: Payer: 59 | Admitting: Obstetrics and Gynecology

## 2015-04-03 ENCOUNTER — Ambulatory Visit (HOSPITAL_COMMUNITY)
Admission: RE | Admit: 2015-04-03 | Discharge: 2015-04-03 | Disposition: A | Discharge status: Home / Self Care | Payer: 59 | Source: Ambulatory Visit | Attending: Family Medicine | Admitting: Family Medicine

## 2015-04-03 ENCOUNTER — Encounter (HOSPITAL_COMMUNITY): Payer: Self-pay

## 2015-04-03 ENCOUNTER — Ambulatory Visit (INDEPENDENT_AMBULATORY_CARE_PROVIDER_SITE_OTHER): Payer: 59 | Admitting: Obstetrics & Gynecology

## 2015-04-03 VITALS — BP 112/76 | HR 96 | Wt 194.0 lb

## 2015-04-03 DIAGNOSIS — Z3A38 38 weeks gestation of pregnancy: Secondary | ICD-10-CM | POA: Diagnosis present

## 2015-04-03 DIAGNOSIS — O09299 Supervision of pregnancy with other poor reproductive or obstetric history, unspecified trimester: Secondary | ICD-10-CM | POA: Insufficient documentation

## 2015-04-03 DIAGNOSIS — Z3A37 37 weeks gestation of pregnancy: Secondary | ICD-10-CM | POA: Insufficient documentation

## 2015-04-03 DIAGNOSIS — O09293 Supervision of pregnancy with other poor reproductive or obstetric history, third trimester: Secondary | ICD-10-CM

## 2015-04-03 DIAGNOSIS — Z3483 Encounter for supervision of other normal pregnancy, third trimester: Secondary | ICD-10-CM

## 2015-04-03 NOTE — Progress Notes (Signed)
Routine visit. Good FM. Labor precautions. No probs.

## 2015-04-05 ENCOUNTER — Inpatient Hospital Stay (HOSPITAL_COMMUNITY): Payer: 59 | Admitting: Anesthesiology

## 2015-04-05 ENCOUNTER — Inpatient Hospital Stay (HOSPITAL_COMMUNITY)
Admission: AD | Admit: 2015-04-05 | Discharge: 2015-04-07 | DRG: 775 | Disposition: A | Discharge status: Home / Self Care | Payer: 59 | Source: Ambulatory Visit | Attending: Obstetrics and Gynecology | Admitting: Obstetrics and Gynecology

## 2015-04-05 ENCOUNTER — Encounter (HOSPITAL_COMMUNITY): Payer: Self-pay

## 2015-04-05 DIAGNOSIS — Z3A38 38 weeks gestation of pregnancy: Secondary | ICD-10-CM | POA: Diagnosis not present

## 2015-04-05 DIAGNOSIS — Z3483 Encounter for supervision of other normal pregnancy, third trimester: Secondary | ICD-10-CM

## 2015-04-05 DIAGNOSIS — IMO0001 Reserved for inherently not codable concepts without codable children: Secondary | ICD-10-CM

## 2015-04-05 DIAGNOSIS — O09293 Supervision of pregnancy with other poor reproductive or obstetric history, third trimester: Secondary | ICD-10-CM

## 2015-04-05 DIAGNOSIS — O99210 Obesity complicating pregnancy, unspecified trimester: Secondary | ICD-10-CM

## 2015-04-05 DIAGNOSIS — N858 Other specified noninflammatory disorders of uterus: Secondary | ICD-10-CM | POA: Diagnosis present

## 2015-04-05 LAB — CBC
HEMATOCRIT: 35.2 % — AB (ref 36.0–46.0)
HEMOGLOBIN: 11.9 g/dL — AB (ref 12.0–15.0)
MCH: 28.5 pg (ref 26.0–34.0)
MCHC: 33.8 g/dL (ref 30.0–36.0)
MCV: 84.4 fL (ref 78.0–100.0)
Platelets: 192 10*3/uL (ref 150–400)
RBC: 4.17 MIL/uL (ref 3.87–5.11)
RDW: 14.8 % (ref 11.5–15.5)
WBC: 13.7 10*3/uL — AB (ref 4.0–10.5)

## 2015-04-05 LAB — RPR: RPR: NONREACTIVE

## 2015-04-05 LAB — TYPE AND SCREEN
ABO/RH(D): O POS
ANTIBODY SCREEN: NEGATIVE

## 2015-04-05 LAB — ABO/RH: ABO/RH(D): O POS

## 2015-04-05 MED ORDER — BENZOCAINE-MENTHOL 20-0.5 % EX AERO
1.0000 "application " | INHALATION_SPRAY | CUTANEOUS | Status: DC | PRN
  Administered 2015-04-06: 1 via TOPICAL
  Filled 2015-04-05: qty 56

## 2015-04-05 MED ORDER — DIPHENHYDRAMINE HCL 50 MG/ML IJ SOLN: 12.5000 mg | INTRAMUSCULAR | Status: DC | PRN

## 2015-04-05 MED ORDER — DIPHENHYDRAMINE HCL 25 MG PO CAPS
25.0000 mg | ORAL_CAPSULE | Freq: Four times a day (QID) | ORAL | Status: DC | PRN
Start: 2015-04-05 — End: 2015-04-07

## 2015-04-05 MED ORDER — ACETAMINOPHEN 325 MG PO TABS
650.0000 mg | ORAL_TABLET | ORAL | Status: DC | PRN
Start: 2015-04-05 — End: 2015-04-07

## 2015-04-05 MED ORDER — ACETAMINOPHEN 325 MG PO TABS: 650.0000 mg | ORAL_TABLET | ORAL | Status: DC | PRN

## 2015-04-05 MED ORDER — PHENYLEPHRINE 40 MCG/ML (10ML) SYRINGE FOR IV PUSH (FOR BLOOD PRESSURE SUPPORT)
80.0000 ug | PREFILLED_SYRINGE | INTRAVENOUS | Status: DC | PRN
  Filled 2015-04-05: qty 2
  Filled 2015-04-05: qty 20

## 2015-04-05 MED ORDER — DIBUCAINE 1 % RE OINT: 1.0000 "application " | TOPICAL_OINTMENT | RECTAL | Status: DC | PRN

## 2015-04-05 MED ORDER — LIDOCAINE HCL (PF) 1 % IJ SOLN
INTRAMUSCULAR | Status: DC | PRN
  Administered 2015-04-05 (×2): 5 mL
  Administered 2015-04-05: 3 mL

## 2015-04-05 MED ORDER — OXYCODONE-ACETAMINOPHEN 5-325 MG PO TABS: 2.0000 | ORAL_TABLET | ORAL | Status: DC | PRN

## 2015-04-05 MED ORDER — OXYCODONE-ACETAMINOPHEN 5-325 MG PO TABS: 1.0000 | ORAL_TABLET | ORAL | Status: DC | PRN

## 2015-04-05 MED ORDER — PRENATAL MULTIVITAMIN CH
1.0000 | ORAL_TABLET | Freq: Every day | ORAL | Status: DC
  Administered 2015-04-06 – 2015-04-07 (×2): 1 via ORAL
  Filled 2015-04-05 (×2): qty 1

## 2015-04-05 MED ORDER — WITCH HAZEL-GLYCERIN EX PADS: 1.0000 "application " | MEDICATED_PAD | CUTANEOUS | Status: DC | PRN

## 2015-04-05 MED ORDER — IBUPROFEN 600 MG PO TABS
600.0000 mg | ORAL_TABLET | Freq: Four times a day (QID) | ORAL | Status: DC
  Administered 2015-04-05 – 2015-04-07 (×8): 600 mg via ORAL
  Filled 2015-04-05 (×8): qty 1

## 2015-04-05 MED ORDER — LIDOCAINE HCL (PF) 1 % IJ SOLN
30.0000 mL | INTRAMUSCULAR | Status: DC | PRN
  Filled 2015-04-05: qty 30

## 2015-04-05 MED ORDER — ONDANSETRON HCL 4 MG/2ML IJ SOLN
4.0000 mg | INTRAMUSCULAR | Status: DC | PRN
Start: 2015-04-05 — End: 2015-04-07

## 2015-04-05 MED ORDER — SIMETHICONE 80 MG PO CHEW: 80.0000 mg | CHEWABLE_TABLET | ORAL | Status: DC | PRN

## 2015-04-05 MED ORDER — ONDANSETRON HCL 4 MG/2ML IJ SOLN: 4.0000 mg | Freq: Four times a day (QID) | INTRAMUSCULAR | Status: DC | PRN

## 2015-04-05 MED ORDER — FENTANYL CITRATE (PF) 100 MCG/2ML IJ SOLN: 50.0000 ug | INTRAMUSCULAR | Status: DC | PRN

## 2015-04-05 MED ORDER — OXYTOCIN 40 UNITS IN LACTATED RINGERS INFUSION - SIMPLE MED
62.5000 mL/h | INTRAVENOUS | Status: DC
  Filled 2015-04-05: qty 1000

## 2015-04-05 MED ORDER — EPHEDRINE 5 MG/ML INJ
10.0000 mg | INTRAVENOUS | Status: DC | PRN
  Filled 2015-04-05: qty 2

## 2015-04-05 MED ORDER — CITRIC ACID-SODIUM CITRATE 334-500 MG/5ML PO SOLN: 30.0000 mL | ORAL | Status: DC | PRN

## 2015-04-05 MED ORDER — LACTATED RINGERS IV BOLUS (SEPSIS)
1000.0000 mL | Freq: Once | INTRAVENOUS | Status: AC
  Administered 2015-04-05: 1000 mL via INTRAVENOUS

## 2015-04-05 MED ORDER — FENTANYL 2.5 MCG/ML BUPIVACAINE 1/10 % EPIDURAL INFUSION (WH - ANES)
14.0000 mL/h | INTRAMUSCULAR | Status: DC | PRN
  Administered 2015-04-05 (×3): 14 mL/h via EPIDURAL
  Filled 2015-04-05 (×2): qty 125

## 2015-04-05 MED ORDER — ONDANSETRON HCL 4 MG PO TABS: 4.0000 mg | ORAL_TABLET | ORAL | Status: DC | PRN

## 2015-04-05 MED ORDER — OXYTOCIN BOLUS FROM INFUSION
500.0000 mL | INTRAVENOUS | Status: DC
  Administered 2015-04-05: 500 mL via INTRAVENOUS

## 2015-04-05 MED ORDER — TETANUS-DIPHTH-ACELL PERTUSSIS 5-2.5-18.5 LF-MCG/0.5 IM SUSP: 0.5000 mL | Freq: Once | INTRAMUSCULAR | Status: DC

## 2015-04-05 MED ORDER — LACTATED RINGERS IV SOLN: 500.0000 mL | INTRAVENOUS | Status: DC | PRN

## 2015-04-05 MED ORDER — LANOLIN HYDROUS EX OINT: TOPICAL_OINTMENT | CUTANEOUS | Status: DC | PRN

## 2015-04-05 MED ORDER — SENNOSIDES-DOCUSATE SODIUM 8.6-50 MG PO TABS
2.0000 | ORAL_TABLET | ORAL | Status: DC
  Administered 2015-04-05 – 2015-04-06 (×2): 2 via ORAL
  Filled 2015-04-05 (×2): qty 2

## 2015-04-05 MED ORDER — FENTANYL CITRATE (PF) 100 MCG/2ML IJ SOLN
100.0000 ug | Freq: Once | INTRAMUSCULAR | Status: AC
Start: 2015-04-05 — End: 2015-04-05
  Administered 2015-04-05: 100 ug via INTRAVENOUS
  Filled 2015-04-05: qty 2

## 2015-04-05 MED ORDER — ZOLPIDEM TARTRATE 5 MG PO TABS: 5.0000 mg | ORAL_TABLET | Freq: Every evening | ORAL | Status: DC | PRN

## 2015-04-05 MED ORDER — LACTATED RINGERS IV SOLN
INTRAVENOUS | Status: DC
  Administered 2015-04-05: 07:00:00 via INTRAVENOUS

## 2015-04-05 MED ORDER — FLEET ENEMA 7-19 GM/118ML RE ENEM: 1.0000 | ENEMA | RECTAL | Status: DC | PRN

## 2015-04-05 NOTE — Progress Notes (Addendum)
Melissa Shah is a 29 y.o. G2P0100 at [redacted]w[redacted]d by LMP admitted for active labor  Subjective: Patient reports that she is doing well at this time.  Denies any concerns  Objective: BP 130/88 mmHg  Pulse 85  Temp(Src) 98.6 F (37 C) (Oral)  Resp 18  Ht 5\' 2"  (1.575 m)  Wt 193 lb (87.544 kg)  BMI 35.29 kg/m2  SpO2 100%  LMP 07/11/2014     FHT:  FHR: 135 bpm, variability: moderate,  accelerations:  Present,  decelerations:  Present variable UC:   irregular, every 6 minutes SVE:   Dilation: 5.5 Effacement (%): 90 Station: -2 Exam by:: [employer] RN  Labs: Lab Results  Component Value Date   WBC 13.7* 04/05/2015   HGB 11.9* 04/05/2015   HCT 35.2* 04/05/2015   MCV 84.4 04/05/2015   PLT 192 04/05/2015    Assessment / Plan: Anticipate NSVD.  Labor: Will continue to monitor.  Once baby well applied, will AROM. Fetal Wellbeing:  Category I Pain Control:  Epidural I/D:  n/a Anticipated MOD:  NSVD  Janora Norlander, DO 04/05/2015, 8:56 AM

## 2015-04-05 NOTE — Anesthesia Preprocedure Evaluation (Signed)
Anesthesia Evaluation  Patient identified by MRN, date of birth, ID band Patient awake    Reviewed: Allergy & Precautions, H&P , NPO status , Patient's Chart, lab work & pertinent test results  History of Anesthesia Complications Negative for: history of anesthetic complications  Airway Mallampati: II  TM Distance: >3 FB Neck ROM: full    Dental no notable dental hx. (+) Teeth Intact   Pulmonary neg pulmonary ROS,  breath sounds clear to auscultation  Pulmonary exam normal       Cardiovascular negative cardio ROS Normal cardiovascular examRhythm:regular Rate:Normal     Neuro/Psych negative neurological ROS  negative psych ROS   GI/Hepatic negative GI ROS, Neg liver ROS,   Endo/Other  negative endocrine ROS  Renal/GU negative Renal ROS  negative genitourinary   Musculoskeletal   Abdominal   Peds  Hematology negative hematology ROS (+)   Anesthesia Other Findings   Reproductive/Obstetrics (+) Pregnancy                             Anesthesia Physical Anesthesia Plan  ASA: II  Anesthesia Plan: Epidural   Post-op Pain Management:    Induction:   Airway Management Planned:   Additional Equipment:   Intra-op Plan:   Post-operative Plan:   Informed Consent: I have reviewed the patients History and Physical, chart, labs and discussed the procedure including the risks, benefits and alternatives for the proposed anesthesia with the patient or authorized representative who has indicated his/her understanding and acceptance.     Plan Discussed with:   Anesthesia Plan Comments:         Anesthesia Quick Evaluation

## 2015-04-05 NOTE — MAU Note (Signed)
Contractions last couple of days but stronger since 2330. Denies LOF or bleeding

## 2015-04-05 NOTE — Progress Notes (Signed)
Assisted patient to bathroom with stedy.  Voided a large amount, peri care performed, back to bed.  Tolerated well.

## 2015-04-05 NOTE — H&P (Signed)
HPI: Melissa Shah is a 29 y.o. year old G43P0100 female at [redacted]w[redacted]d weeks gestation who presents to MAU reporting Labor. Cervix changed from 2 to 5 cm in MAU. In Antenatal testing for Hx IUFD at 25 week w/ previous pregnancy. 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   Clinic [place]  Dating  LMP consistent with 1st trimester  Genetic Screen  Normal NT and NB   DGL:OVFIEPPI   NIPS: Normal  Anatomic [place] Normal  GTT Third trimester: 119  TDaP vaccine 03/2014; 01/23/14  Flu vaccine 09/2014  GBS neg  Contraception [place] [facility]  Circumcision Yes  Pediatrician [place], and parents   Maternal Medical History:  Reason for admission: Nausea.    OB History    Gravida Para Term Preterm AB TAB SAB Ectopic Multiple Living   2 1  1             Obstetric Comments   03/2014 stillborn, vaginal delivery     Past Medical History  Diagnosis Date  . S/P ACL surgery 2013    complete replacement cadaver acl  . Fibrocystic breast 2011    2011-2013 monitored  . Hemorrhoid 2015   Past Surgical History  Procedure Laterality Date  . Cervical polyp removal  2012  . Anterior cruciate ligament repair     Family History: family history includes Diabetes in her father; Fibrocystic breast disease in her mother; Fibroids in her mother; Hypertension in her maternal grandmother. Social History:  reports that she has never smoked. She has never used smokeless tobacco. She reports that she drinks alcohol. She reports that she does not use illicit drugs.   Prenatal Transfer Tool  Maternal Diabetes: No Genetic Screening: Normal Maternal Ultrasounds/Referrals: Normal Fetal Ultrasounds or other Referrals:  None Maternal Substance Abuse:  No Significant Maternal Medications:  None Significant Maternal Lab Results:  Lab values include: Group B Strep negative Other Comments:  None  Review of Systems  Constitutional: Negative for fever and chills.  Eyes: Negative for blurred vision and  double vision.  Gastrointestinal: Positive for abdominal pain (contractions). Negative for nausea and vomiting.  Genitourinary:       Neg for vaginal bleeding or LOF  Neurological: Negative for headaches.    Dilation: 5 Effacement (%): 90 Station: -2 Exam by:: [employer] RN Blood pressure 123/89, pulse 90, temperature 97.8 F (36.6 C), temperature source Oral, resp. rate 18, height 5\' 2"  (1.575 m), weight 193 lb (87.544 kg), last menstrual period 07/11/2014, SpO2 100 %. Maternal Exam:  Uterine Assessment: Contraction strength is firm.  Contraction frequency is regular.   Abdomen: Patient reports no abdominal tenderness. Fundal height is AGA.   Fetal presentation: vertex  Cervix: not evaluated. RN examined  Fetal Exam Fetal Monitor Review: Mode: ultrasound.   Baseline rate: 135.  Variability: moderate (6-25 bpm).   Pattern: accelerations present and variable decelerations.    Fetal State Assessment: Category II - tracings are indeterminate.     Physical Exam  Nursing note and vitals reviewed. Constitutional: She is oriented to person, place, and time. She appears well-developed and well-nourished.  HENT:  Head: Normocephalic.  Eyes: Conjunctivae are normal.  Cardiovascular: Normal rate, regular rhythm and normal heart sounds.   Respiratory: Effort normal and breath sounds normal.  GI: Soft. There is no tenderness.  Musculoskeletal: She exhibits no edema or tenderness.  Neurological: She is alert and oriented to person, place, and time. She has normal reflexes.  Skin: Skin is warm and  dry.  Psychiatric: She has a normal mood and affect.    Prenatal labs: ABO, Rh: --/--/O POS (05/08 0520) Antibody: NEG (05/08 0520) Rubella: Immune (09/23 0000) RPR: Non Reactive (02/26 0902)  HBsAg: Negative (09/23 0930)  HIV: Non-reactive (02/26 0000)  GBS: Negative (04/22 0000)   Assessment: 1. Labor: Active 2. Fetal Wellbeing: Category I  3. Pain Control: Epidural 4. GBS:  neg 5. 38.2 week IUP  Plan:  1. Admit to BS per consult with MD 2. Routine L&D orders 3. Analgesia/anesthesia PRN    Manya Silvas 04/05/2015, 8:39 AM

## 2015-04-05 NOTE — Progress Notes (Signed)
Melissa Shah is a 29 y.o. G2P0100 at [redacted]w[redacted]d by LMP admitted for active labor  Subjective: Patient resting.  Epidural controlling pain well.  Objective: BP 113/59 mmHg  Pulse 87  Temp(Src) 98.8 F (37.1 C) (Oral)  Resp 18  Ht 5\' 2"  (1.575 m)  Wt 193 lb (87.544 kg)  BMI 35.29 kg/m2  SpO2 100%  LMP 07/11/2014     FHT:  FHR: 150 bpm, variability: moderate,  accelerations:  Present,  decelerations:  Present 2 variable decels appreciated UC:   irregular, every 2-5 minutes SVE:  Dilation: 6.5 Effacement (%): 90 Cervical Position: Middle Station: -1, 0 Presentation: Vertex Exam by:: [employer] RN  Labs: Lab Results  Component Value Date   WBC 13.7* 04/05/2015   HGB 11.9* 04/05/2015   HCT 35.2* 04/05/2015   MCV 84.4 04/05/2015   PLT 192 04/05/2015    Assessment / Plan: Anticipate NSVD.  Labor: Doing well.  Expect NSVD Fetal Wellbeing:  Category I Pain Control:  Epidural I/D:  n/a Anticipated MOD:  NSVD  Janora Norlander, DO 04/05/2015, 12:51 PM

## 2015-04-05 NOTE — Anesthesia Procedure Notes (Signed)
Epidural Patient location during procedure: OB  Staffing Anesthesiologist: Montez Hageman Performed by: anesthesiologist   Preanesthetic Checklist Completed: patient identified, site marked, surgical consent, pre-op evaluation, timeout performed, IV checked, risks and benefits discussed and monitors and equipment checked  Epidural Patient position: sitting Prep: DuraPrep Patient monitoring: heart rate, continuous pulse ox and blood pressure Approach: right paramedian Location: L3-L4 Injection technique: LOR saline  Needle:  Needle type: Tuohy  Needle gauge: 17 G Needle length: 9 cm and 9 Needle insertion depth: 6 cm Catheter type: closed end flexible Catheter size: 20 Guage Catheter at skin depth: 10 cm Test dose: negative  Assessment Events: blood not aspirated, injection not painful, no injection resistance, negative IV test and no paresthesia  Additional Notes   Patient tolerated the insertion well without complications.

## 2015-04-05 NOTE — Progress Notes (Signed)
Laquesha Holcomb is a 29 y.o. G2P0100 at [redacted]w[redacted]d by LMP admitted for active labor  Subjective: Patient reports that she is doing well at this time.  Denies any concerns  Objective: BP 114/59 mmHg  Pulse 85  Temp(Src) 98.6 F (37 C) (Oral)  Resp 18  Ht 5\' 2"  (1.575 m)  Wt 193 lb (87.544 kg)  BMI 35.29 kg/m2  SpO2 100%  LMP 07/11/2014     FHT:  FHR: 140 bpm, variability: moderate,  accelerations:  Present,  decelerations:  Absent UC:   irregular, every 8-10 minutes SVE:  Dilation: 5.5 Effacement (%): 80 Cervical Position: Middle Station: -1 Presentation: Vertex Exam by:: Dr. Lajuana Ripple  Labs: Lab Results  Component Value Date   WBC 13.7* 04/05/2015   HGB 11.9* 04/05/2015   HCT 35.2* 04/05/2015   MCV 84.4 04/05/2015   PLT 192 04/05/2015    Assessment / Plan: Anticipate NSVD.  Labor: Will continue to monitor.  AROM@ 1050.  Fluid clear. Fetal Wellbeing:  Category I Pain Control:  Epidural I/D:  n/a Anticipated MOD:  NSVD  Janora Norlander, DO 04/05/2015, 11:01 AM

## 2015-04-06 NOTE — Progress Notes (Signed)
Post Partum Day 1 Subjective: no complaints, up ad lib, voiding and tolerating PO  Objective: Blood pressure 114/79, pulse 73, temperature 98.2 F (36.8 C), temperature source Oral, resp. rate 18, height 5\' 2"  (1.575 m), weight 193 lb (87.544 kg), last menstrual period 07/11/2014, SpO2 100 %, unknown if currently breastfeeding.  Physical Exam:  General: alert, cooperative, appears stated age and no distress Lochia: appropriate Uterine Fundus: firm Incision: n/a DVT Evaluation: No evidence of DVT seen on physical exam. Negative Homan's sign. No cords or calf tenderness. No significant calf/ankle edema.   Recent Labs  04/05/15 0520  HGB 11.9*  HCT 35.2*    Assessment/Plan: Plan for discharge tomorrow   LOS: 1 day   LA[place], [place] 04/06/2015, 7:08 AM

## 2015-04-06 NOTE — Lactation Note (Signed)
This note was copied from the chart of [place]. Lactation Consultation Note; Mother states she is pumping every 2-3 hours. She states she is only getting a few drops. Reviewed using good breast massage and hand expression.  Infant was  given 10 ml with the last bottle. Advised mother in supplementing infant with more volume , ebm/formula every 2-3 hours . Mother has her own Medela pump at home. Mother receptive to all teaching.   Patient Name: Melissa Shah [id number] Date: 04/06/2015 Reason for consult: Follow-up assessment   Maternal Data    Feeding Feeding Type: Bottle Fed - Formula Nipple Type: Slow - flow  LATCH Score/Interventions                      Lactation Tools Discussed/Used     Consult Status Consult Status: Follow-up Date: 04/06/15 Follow-up type: In-patient    Jess Barters [facility] 04/06/2015, 3:14 PM

## 2015-04-06 NOTE — Lactation Note (Signed)
This note was copied from the chart of [place]. Lactation Consultation Note Mom plans to pump and exclusively bottle feed. She doesn't want to put the baby to the breast. Mom has DEBP at home (pump and style) Mom is supplementing w/formula until she can get something to give her baby. She knows to hand express, she knows the importance of pumping to stimulate her breast every three hours.\ Milk storage discussed, I&O monitoring, cleaning pump, breast stimulation, supply and demand, engorgement, and hand expression.  Mom encouraged to feed baby 8-12 times/24 hours and with feeding cues. Mom encouraged to feed baby w/feeding cues. Educated about newborn behavior. Mom shown how to use DEBP & how to disassemble, clean, & reassemble parts.[place] brochure given w/resources, support groups and [place] services.Reviewed Baby & Me book's Breastfeeding Basics. Paced bottle-feeding taught. Mom knows to pump q3h for 15-20 min. Patient Name: Melissa Shah [id number] Date: 04/06/2015 Reason for consult: Initial assessment   Maternal Data Has patient been taught Hand Expression?: Yes Does the patient have breastfeeding experience prior to this delivery?: No  Feeding Feeding Type:  (mom is pumping ) Nipple Type:  (mom did not feed again)  LATCH Score/Interventions       Intervention(s): Double electric pump        Intervention(s): Breastfeeding basics reviewed;Support Pillows;Position options;Skin to skin     Lactation Tools Discussed/Used Tools: Pump Breast pump type: Double-Electric Breast Pump Pump Review: Setup, frequency, and cleaning;Milk Storage Date initiated:: 04/05/15   Consult Status Consult Status: Follow-up Date: 04/07/15 Follow-up type: In-patient    Theodoro Kalata 04/06/2015, 5:23 AM

## 2015-04-06 NOTE — Anesthesia Postprocedure Evaluation (Signed)
  Anesthesia Post-op Note  Patient: Melissa Shah  Procedure(s) Performed: * No procedures listed *  Patient Location: Mother/Baby  Anesthesia Type:Epidural  Level of Consciousness: awake, alert , oriented and patient cooperative  Airway and Oxygen Therapy: Patient Spontanous Breathing  Post-op Pain: none  Post-op Assessment: Post-op Vital signs reviewed, Patient's Cardiovascular Status Stable, Respiratory Function Stable, Patent Airway, No headache, No backache, No residual numbness and No residual motor weakness  Post-op Vital Signs: Reviewed and stable  Last Vitals:  Filed Vitals:   04/06/15 0437  BP: 114/79  Pulse: 73  Temp: 36.8 C  Resp: 18    Complications: No apparent anesthesia complications

## 2015-04-07 ENCOUNTER — Ambulatory Visit (HOSPITAL_COMMUNITY): Payer: 59

## 2015-04-07 MED ORDER — IBUPROFEN 600 MG PO TABS: 600.0000 mg | ORAL_TABLET | Freq: Four times a day (QID) | ORAL | Status: AC

## 2015-04-07 NOTE — Discharge Summary (Signed)
Obstetric Discharge Summary Reason for Admission: onset of labor Prenatal Procedures: ultrasound Intrapartum Procedures: spontaneous vaginal delivery Postpartum Procedures: none Complications-Operative and Postpartum: small  periurethral laceration that did not require repair  Patient is 29 y.o. G2P0100 [redacted]w[redacted]d admitted SOL Delivery Note At 2:49 PM a viable female was delivered via Vaginal, Spontaneous Delivery (Presentation: Right Occiput Anterior). APGAR: 8,9; weight pending. Infant dried and placed on mother's abdomen. Cord clamped and cut by FOB. Hospital cord blood sample collected. Placenta delivered, fundal massage applied, and vagina inspected for lacerations. A hemostatic, mild periurethral appreciated. Placenta status: intact. Spontaneous. Cord: 3 vessels with the following complications: None.   Delivery of placenta was done by Dorina Hoyer, MS3. 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   Daiva Nakayama, CNM gloved and actively participated in the entirety of the delivery.  Anesthesia: Epidural  Episiotomy: None Lacerations: Periurethral Suture Repair: hemostatic and did not require repair Est. Blood Loss (mL): 100cc  Mom to postpartum. Baby to Couplet care / Skin to Skin.  Hospital Course:  Active Problems:   SVD (spontaneous vaginal delivery)  Melissa Shah is a 29 y.o. G2P1101 s/p SVD.  Patient was admitted SOL.  She had a postpartum course that was uncomplicated including no problems with ambulating, PO intake, urination, pain, or bleeding. The patient feels ready to go home and will be discharged with outpatient follow-up.   Today: No acute events overnight.  Pt denies problems with ambulating, voiding or po intake.  She denies nausea or vomiting.  Pain is well controlled.  She has had flatus. She has had bowel movement.  Lochia Small.  Plan for birth control is IUD.  Method of Feeding: Breast/Bottle  Physical Exam:  Blood  pressure 117/69, pulse 67, temperature 97.7 F (36.5 C), temperature source Oral, resp. rate 18, height 5\' 2"  (1.575 m), weight 193 lb (87.544 kg), last menstrual period 07/11/2014, SpO2 100 %, unknown if currently breastfeeding.  General: alert, cooperative, appears stated age and no distress  Cardio: RRR no murmurs Pulm: CTAB, no increased WOB GI: soft, +BS Lochia: appropriate Uterine Fundus: firm Ext: WWP, trace pedal edema b/l, +2DP DVT Evaluation: No evidence of DVT seen on physical exam. Negative Homan's sign. No cords or calf tenderness.  H/H: Lab Results  Component Value Date/Time   HGB 11.9* 04/05/2015 05:20 AM   HCT 35.2* 04/05/2015 05:20 AM    Discharge Diagnoses: Term Pregnancy-delivered  Discharge Information: Date: 04/07/2015 Activity: pelvic rest Diet: routine  Medications: PNV and Ibuprofen Breast feeding:  Yes Condition: stable Instructions: refer to handout Discharge to: home      Medication List    TAKE these medications        folic acid 1 MG tablet  Commonly known as:  FOLVITE  Take 1 tablet (1 mg total) by mouth daily.     ibuprofen 600 MG tablet  Commonly known as:  ADVIL,MOTRIN  Take 1 tablet (600 mg total) by mouth every 6 (six) hours.     multivitamin-prenatal 27-0.8 MG Tabs tablet  Take 1 tablet by mouth daily at 12 noon.       Follow-up Information    Follow up with [facility]. Schedule an appointment as soon as possible for a visit in 6 weeks.   Specialty:  Obstetrics and Gynecology   Why:  post partum care   Contact information:   [place], [place], DO  [place], PGY-1 04/07/2015,9:23 AM  Seen and examined by me also. Agree with note Seabron Spates, CNM

## 2015-04-07 NOTE — Discharge Instructions (Signed)

## 2015-04-10 ENCOUNTER — Encounter: Payer: 59 | Admitting: Obstetrics and Gynecology

## 2015-04-14 ENCOUNTER — Ambulatory Visit (HOSPITAL_COMMUNITY): Payer: 59

## 2015-04-14 ENCOUNTER — Other Ambulatory Visit (HOSPITAL_COMMUNITY): Payer: 59

## 2015-05-14 ENCOUNTER — Encounter: Payer: Self-pay | Admitting: Obstetrics & Gynecology

## 2015-05-14 ENCOUNTER — Ambulatory Visit (INDEPENDENT_AMBULATORY_CARE_PROVIDER_SITE_OTHER): Payer: 59 | Admitting: Obstetrics & Gynecology

## 2015-05-14 DIAGNOSIS — Z30430 Encounter for insertion of intrauterine contraceptive device: Secondary | ICD-10-CM | POA: Diagnosis not present

## 2015-05-14 DIAGNOSIS — Z01812 Encounter for preprocedural laboratory examination: Secondary | ICD-10-CM | POA: Diagnosis not present

## 2015-05-14 LAB — POCT URINE PREGNANCY: Preg Test, Ur: NEGATIVE

## 2015-05-14 MED ORDER — METRONIDAZOLE 500 MG PO TABS: 500.0000 mg | ORAL_TABLET | Freq: Two times a day (BID) | ORAL | Status: DC

## 2015-05-14 NOTE — Progress Notes (Signed)
  Subjective:     Melissa Shah is a 29 y.o. female who presents for a postpartum visit. She is 6 weeks postpartum following a spontaneous vaginal delivery. I have fully reviewed the prenatal and intrapartum course. The delivery was at 59 gestational weeks. Outcome: spontaneous vaginal delivery. Anesthesia: epidural. Postpartum course has been normal. Baby's course has been normal. Baby is feeding by breast. Bleeding no bleeding. Bowel function is normal. Bladder function is normal. Patient is not sexually active. Contraception method is IUD. Postpartum depression screening: negative.  The following portions of the patient's history were reviewed and updated as appropriate: allergies, current medications, past family history, past medical history, past social history, past surgical history and problem list.  Review of Systems A comprehensive review of systems was negative.   Objective:    There were no vitals taken for this visit.  General:  alert   Breasts:  inspection negative, no nipple discharge or bleeding, no masses or nodularity palpable  Lungs: clear to auscultation bilaterally  Heart:  regular rate and rhythm, S1, S2 normal, no murmur, click, rub or gallop  Abdomen: soft, non-tender; bowel sounds normal; no masses,  no organomegaly   Vulva:  normal  Vagina: normal  Cervix:  absent  Corpus: normal  Adnexa:  normal adnexa  Rectal Exam: Not performed.       UPT negative, consent signed, Time out procedure done. Cervix prepped with betadine and grasped with a single tooth tenaculum. [name] was easily placed and the strings were cut to 3-4 cm. Uterus sounded to 9 cm. She tolerated the procedure well.    Assessment:    Normal postpartum exam. Pap smear not done at today's visit.   Plan:     Contraception: IUD  F/U 2 months for IUD check

## 2015-05-15 ENCOUNTER — Telehealth: Payer: Self-pay | Admitting: *Deleted

## 2015-05-15 DIAGNOSIS — N760 Acute vaginitis: Secondary | ICD-10-CM

## 2015-05-15 DIAGNOSIS — B9689 Other specified bacterial agents as the cause of diseases classified elsewhere: Secondary | ICD-10-CM

## 2015-05-15 MED ORDER — METRONIDAZOLE 500 MG PO TABS: 500.0000 mg | ORAL_TABLET | Freq: Two times a day (BID) | ORAL | Status: DC

## 2015-05-15 NOTE — Telephone Encounter (Signed)
Sent Flagyl to local pharmacy versus mail order

## 2015-06-09 ENCOUNTER — Ambulatory Visit (INDEPENDENT_AMBULATORY_CARE_PROVIDER_SITE_OTHER): Payer: 59 | Admitting: Physician Assistant

## 2015-06-09 ENCOUNTER — Encounter: Payer: Self-pay | Admitting: Physician Assistant

## 2015-06-09 VITALS — BP 117/80 | HR 101 | Resp 16 | Ht 62.0 in | Wt 165.0 lb

## 2015-06-09 DIAGNOSIS — N61 Mastitis without abscess: Secondary | ICD-10-CM

## 2015-06-09 DIAGNOSIS — O9122 Nonpurulent mastitis associated with the puerperium: Secondary | ICD-10-CM

## 2015-06-09 MED ORDER — DICLOXACILLIN SODIUM 500 MG PO CAPS: 500.0000 mg | ORAL_CAPSULE | Freq: Four times a day (QID) | ORAL | Status: DC

## 2015-06-09 NOTE — Progress Notes (Signed)
Patient ID: Melissa Shah, female   DOB: [birth year], 29 y.o.   MRN: [id number] History:  Melissa Shah is a 29 y.o. G2P1101 who presents to clinic today for evaluation of breast pain.  She is postpartum from a vagianl delivery 04/05/15.  She is pumping to provide breastmilk to her baby.  Her typical pumping schedule is 6am, 12n, 3pm, 8pm.  Has had same pumping schedule for 2 weeks without problem.  Yesterday, did not pump until much later than usual.   All was fine until midnight.   Left breast very painful.  Feeling fever and chills.  Not getting as much milk on that side.   Headache, loss of appetite, sweats in sleep, fatigue.  Patient very concerned and called into work before presenting here.  She notes her baby seems fine and is not having any concerns.  No prior h/o breast problems.  No meds used for this complaint.    The following portions of the patient's history were reviewed and updated as appropriate: allergies, current medications, past family history, past medical history, past social history, past surgical history and problem list.  Review of Systems:  Pertinent ROS in HPI.  All other systems are negative.   Objective:  Physical Exam BP 117/80 mmHg  Pulse 101  Resp 16  Ht 5\' 2"  (1.575 m)  Wt 165 lb (74.844 kg)  BMI 30.17 kg/m2  LMP 05/14/2015  Breastfeeding? Yes  Temp: 100.6 GENERAL: Well-developed, well-nourished female in no acute distress.  HEENT: Normocephalic, atraumatic. EOM intact RESPIRATORY: Normal rate. NO respiratory distress BREASTS: Left slightly bigger with 2inx1 inch area of erythema noted from 11 o'clock to 1 o'clock.  Warm to touch throughout.  Increased tenderness and induration noted over erythematous area.     Assessment & Plan:  Assessment: 1. Mastitis, left, acute    Plans: Dicloxicillin qid x 10 days Frequent pumping - complete emptying Cool compresses Alternate Tylenol/ibuprofen q 3 hours with persistent fever RTC 48 hours if no improvement.   Melissa Stack, PA-C 06/09/2015 9:25 AM

## 2015-06-09 NOTE — Patient Instructions (Signed)
Mastitis Mastitis is inflammation of the breast tissue. It occurs most often in women who are breastfeeding, but it can also affect other women, and even sometimes men. CAUSES  Mastitis is usually caused by a bacterial infection. Bacteria enter the breast tissue through cuts or openings in the skin. Typically, this occurs with breastfeeding because of cracked or irritated skin. Sometimes, it can occur even when there is no opening in the skin. It can be associated with plugged milk (lactiferous) ducts. Nipple piercing can also lead to mastitis. Also, some forms of breast cancer can cause mastitis. SIGNS AND SYMPTOMS   Swelling, redness, tenderness, and pain in an area of the breast.  Swelling of the glands under the arm on the same side.  Fever. If an infection is allowed to progress, a collection of pus (abscess) may develop. DIAGNOSIS  Your health care provider can usually diagnose mastitis based on your symptoms and a physical exam. Tests may be done to help confirm the diagnosis. These may include:   Removal of pus from the breast by applying pressure to the area. This pus can be examined in the lab to determine which bacteria are present. If an abscess has developed, the fluid in the abscess can be removed with a needle. This can also be used to confirm the diagnosis and determine the bacteria present. In most cases, pus will not be present.  Blood tests to determine if your body is fighting a bacterial infection.  Mammogram or ultrasound tests to rule out other problems or diseases. TREATMENT  Antibiotic medicine is used to treat a bacterial infection. Your health care provider will determine which bacteria are most likely causing the infection and will select an appropriate antibiotic. This is sometimes changed based on the results of tests performed to identify the bacteria, or if there is no response to the antibiotic selected. Antibiotics are usually given by mouth. You may also be  given medicine for pain. Mastitis that occurs with breastfeeding will sometimes go away on its own, so your health care provider may choose to wait 24 hours after first seeing you to decide whether a prescription medicine is needed. HOME CARE INSTRUCTIONS   Only take over-the-counter or prescription medicines for pain, fever, or discomfort as directed by your health care provider.  If your health care provider prescribed an antibiotic, take the medicine as directed. Make sure you finish it even if you start to feel better.  Do not wear a tight or underwire bra. Wear a soft, supportive bra.  Increase your fluid intake, especially if you have a fever.  Women who are breastfeeding should follow these instructions:  Continue to empty the breast. Your health care provider can tell you whether this milk is safe for your infant or needs to be thrown out. You may be told to stop nursing until your health care provider thinks it is safe for your baby. Use a breast pump if you are advised to stop nursing.  Keep your nipples clean and dry.  Empty the first breast completely before going to the other breast. If your baby is not emptying your breasts completely for some reason, use a breast pump to empty your breasts.  If you go back to work, pump your breasts while at work to stay in time with your nursing schedule.  Avoid allowing your breasts to become overly filled with milk (engorged). SEEK MEDICAL CARE IF:   You have pus-like discharge from the breast.  Your symptoms do not   improve with the treatment prescribed by your health care provider within 2 days. SEEK IMMEDIATE MEDICAL CARE IF:   Your pain and swelling are getting worse.  You have pain that is not controlled with medicine.  You have a red line extending from the breast toward your armpit.  You have a fever or persistent symptoms for more than 2-3 days.  You have a fever and your symptoms suddenly get worse. Document Released:  11/14/2005 Document Revised: 11/19/2013 Document Reviewed: 06/14/2013 ExitCare Patient Information 2015 [employer]. This information is not intended to replace advice given to you by your health care provider. Make sure you discuss any questions you have with your health care provider.  

## 2015-09-08 IMAGING — US US OB FOLLOW-UP
1 series · 12 of 28 positions shown · non-contrast
Comparison: none

[Series 1: us ob follow-up · 0.23mm/px · 12 of 36 slices shown]
[im 2/36]
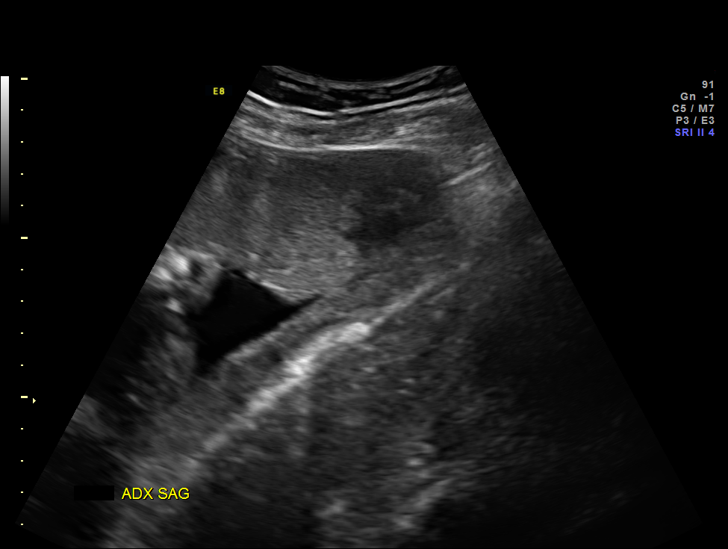
[im 4/36]
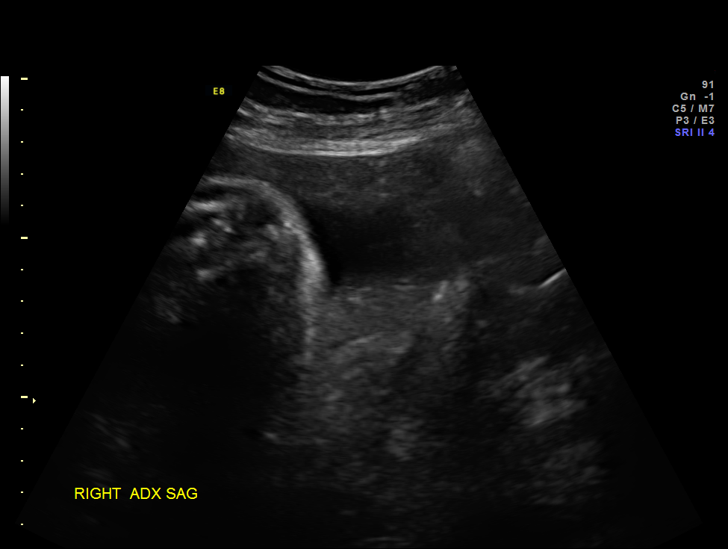
[im 7/36]
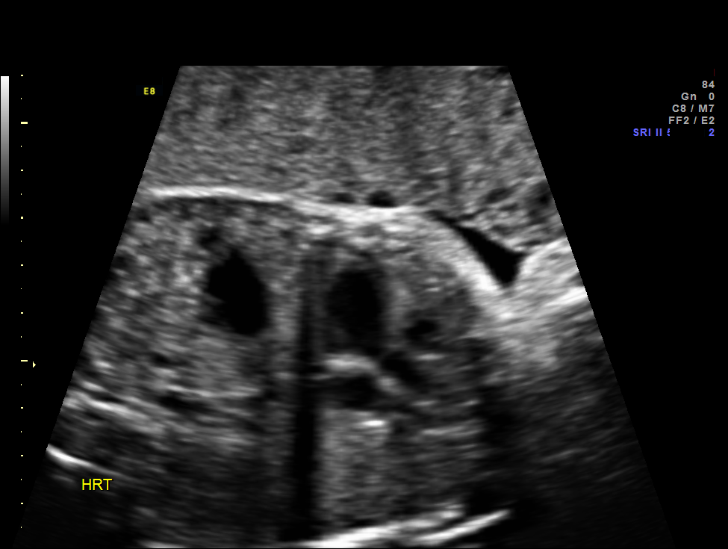
[im 11/36]
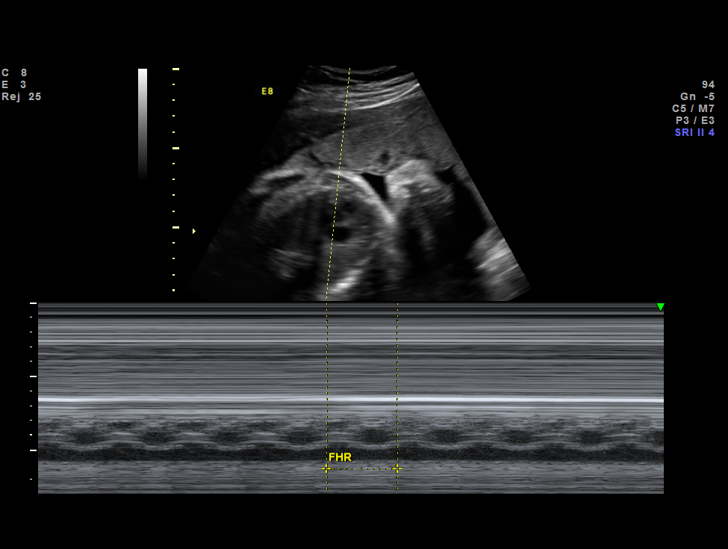
[im 13/36]
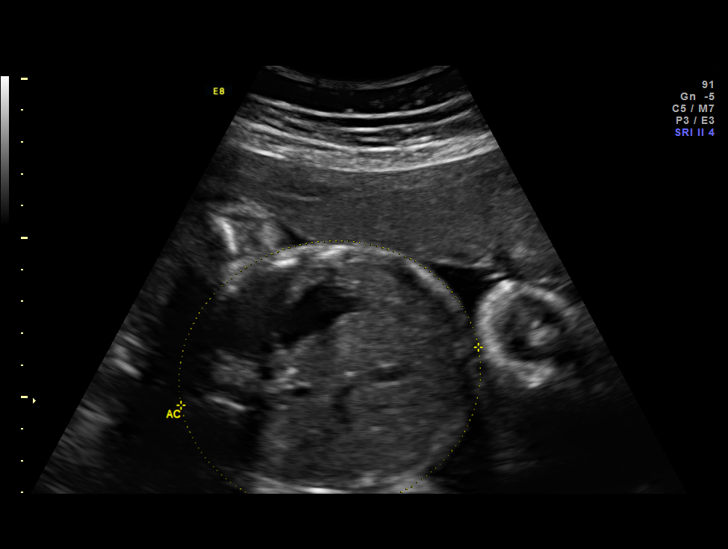
[im 16/36]
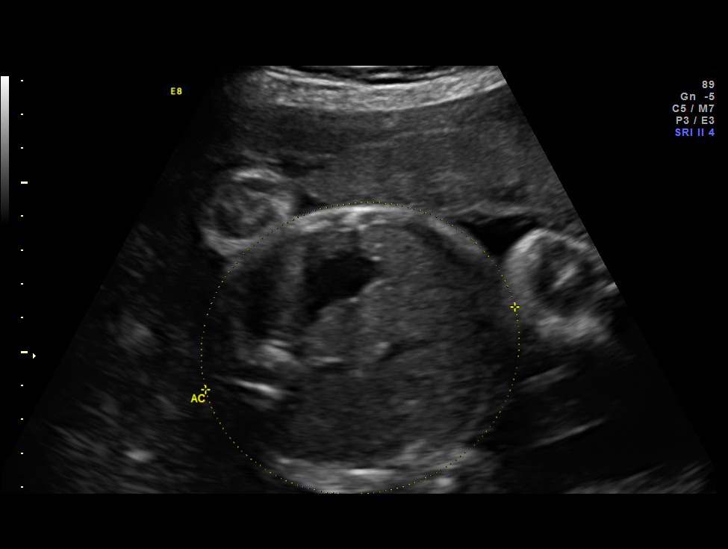
[im 20/36]
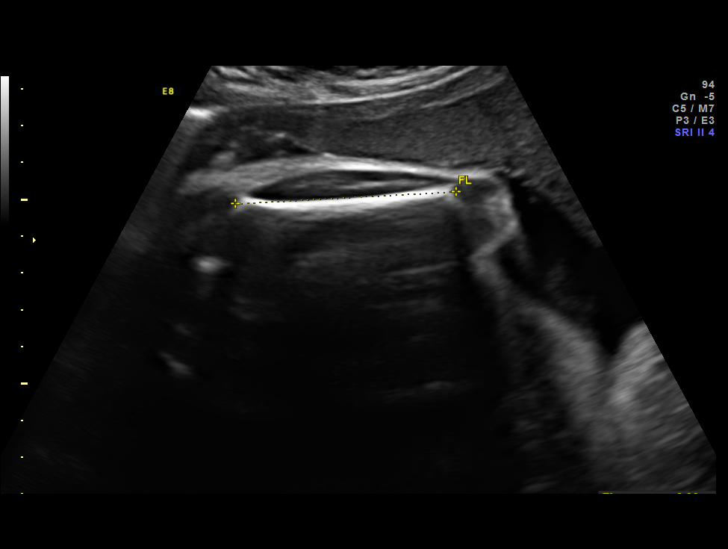
[im 23/36]
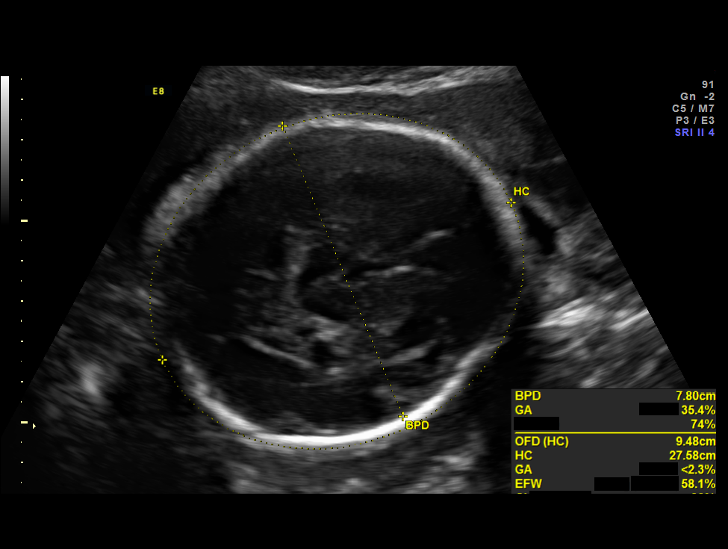
[im 25/36]
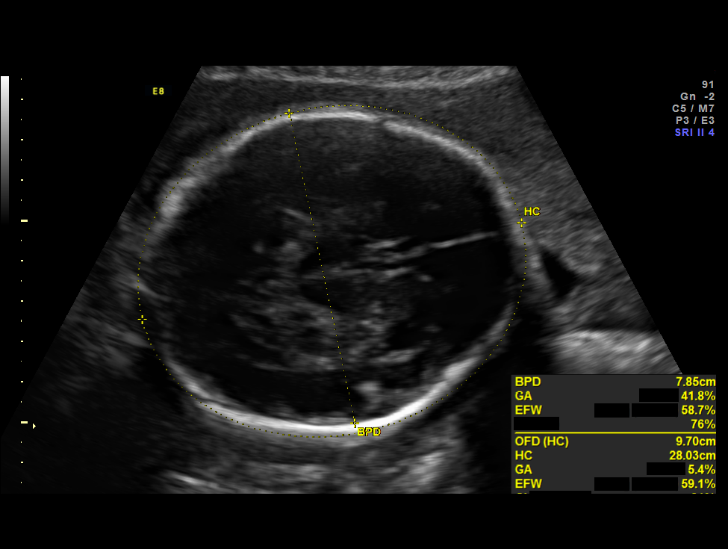
[im 29/36]
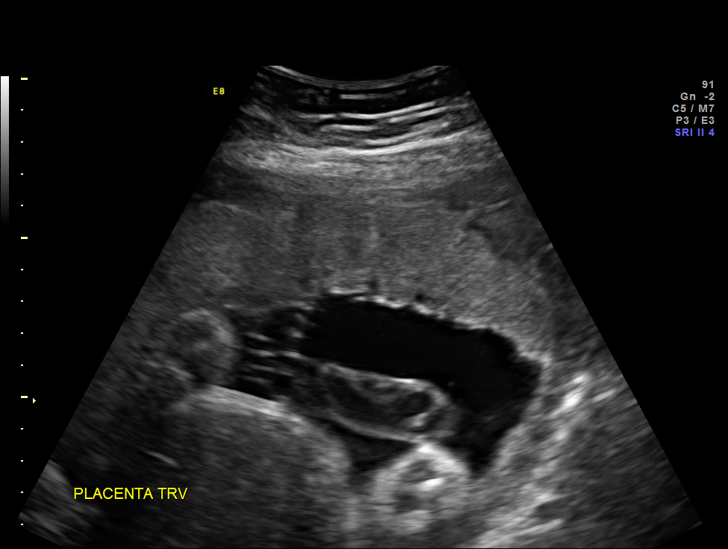
[im 32/36]
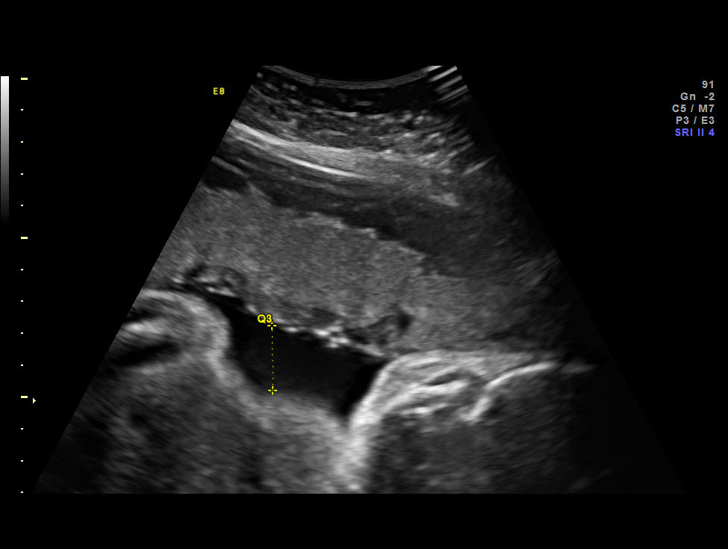
[im 34/36]
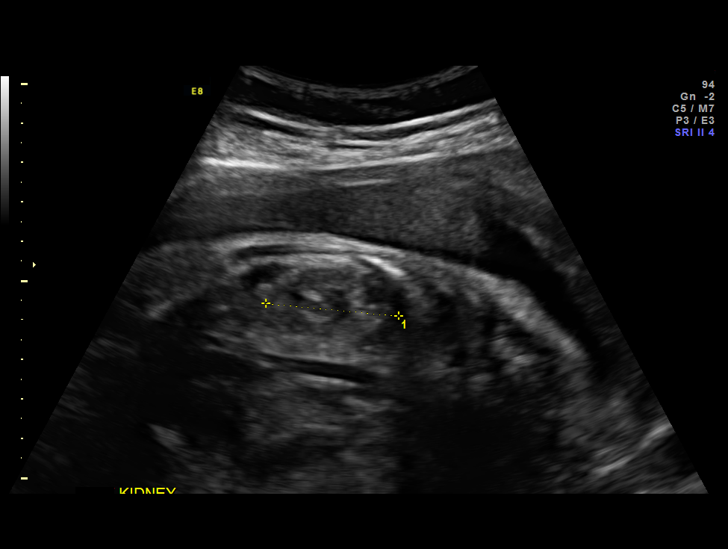

[12 of 28 positions shown; findings below may reference images not displayed]

OBSTETRICS REPORT
                      (Signed Final 02/16/2015 [DATE])

                 RDMS                                    [HOSPITAL]
Service(s) Provided

 US OB FOLLOW UP                                       76816.1
Indications

 Poor obstetric history: Previous IUFD; cord cysts;
 megacystis in first trimester; w/u normal -
 karyotype and microarray (25 weeks)
 31 weeks gestation of pregnancy
Fetal Evaluation

 Num Of Fetuses:    1
 Fetal Heart Rate:  142                          bpm
 Cardiac Activity:  Observed
 Presentation:      Cephalic
 Placenta:          Anterior, above cervical os
 P. Cord            Previously Visualized
 Insertion:

 Amniotic Fluid
 AFI FV:      Subjectively within normal limits
 AFI Sum:     15.59   cm       56  %Tile     Larg Pckt:    6.59  cm
 RUQ:   6.59    cm   RLQ:    1.08   cm    LUQ:   5.89    cm   LLQ:    2.03   cm
Biometry

 BPD:     78.5  mm     G. Age:  31w 4d                CI:         81.7   70 - 86
 OFD:     96.1  mm                                    FL/HC:      21.3   19.3 -

 HC:     280.3  mm     G. Age:  30w 5d        6  %    HC/AC:      0.99   0.96 -

 AC:     283.8  mm     G. Age:  32w 3d       76  %    FL/BPD:     75.9   71 - 87
 FL:      59.6  mm     G. Age:  31w 0d       26  %    FL/AC:      21.0   20 - 24

 Est. FW:    5753  gm      4 lb 1 oz     62  %
Gestational Age

 LMP:           31w 3d        Date:  07/11/14                 EDD:   04/17/15
 U/S Today:     31w 3d                                        EDD:   04/17/15
 Best:          31w 3d     Det. By:  LMP  (07/11/14)          EDD:   04/17/15
Anatomy
 Cranium:          Appears normal         Aortic Arch:      Previously seen
 Fetal Cavum:      Appears normal         Ductal Arch:      Previously seen
 Ventricles:       Appears normal         Diaphragm:        Appears normal
 Choroid Plexus:   Previously seen        Stomach:          Appears normal, left
                                                            sided
 Cerebellum:       Previously seen        Abdomen:          Appears normal
 Posterior Fossa:  Previously seen        Abdominal Wall:   Previously seen
 Nuchal Fold:      Previously seen        Cord Vessels:     Previously seen
 Face:             Orbits and profile     Kidneys:          Not well visualized
                   previously seen
 Lips:             Appears normal         Bladder:          Appears normal
 Heart:            Previously seen        Spine:            Previously seen
 RVOT:             Appears normal         Lower             Previously seen
                                          Extremities:
 LVOT:             Appears normal         Upper             Previously seen
                                          Extremities:

 Other:  Male gender. Heels, 5th digit, and Nasal bone previously visualized.
Cervix Uterus Adnexa

 Cervix:       Not visualized (advanced GA >90wks)

 Adnexa:     No abnormality visualized.
Impression

 Single IUP at 31w 3d
 Hx of previous 25 weeks IUFD- fetus with megacystis/ cord
 cysts
 Normal interval anatomy
 Fetal growth is appropriate (62nd %tile)
 Normal amniotic fluid volume
Recommendations

 Recommend follow-up ultrasound examination in 4 weeks for
 interval growth given history of previous IUFD.

 questions or concerns.

## 2017-09-05 ENCOUNTER — Encounter: Payer: Self-pay | Admitting: Obstetrics & Gynecology

## 2017-09-05 ENCOUNTER — Ambulatory Visit (INDEPENDENT_AMBULATORY_CARE_PROVIDER_SITE_OTHER): Payer: 59 | Admitting: Obstetrics & Gynecology

## 2017-09-05 VITALS — BP 117/83 | HR 84 | Wt 187.4 lb

## 2017-09-05 DIAGNOSIS — Z01419 Encounter for gynecological examination (general) (routine) without abnormal findings: Secondary | ICD-10-CM | POA: Diagnosis not present

## 2017-09-05 NOTE — Patient Instructions (Signed)
Preventive Care 18-39 Years, Female Preventive care refers to lifestyle choices and visits with your health care provider that can promote health and wellness. What does preventive care include?  A yearly physical exam. This is also called an annual well check.  Dental exams once or twice a year.  Routine eye exams. Ask your health care provider how often you should have your eyes checked.  Personal lifestyle choices, including: ? Daily care of your teeth and gums. ? Regular physical activity. ? Eating a healthy diet. ? Avoiding tobacco and drug use. ? Limiting alcohol use. ? Practicing safe sex. ? Taking vitamin and mineral supplements as recommended by your health care provider. What happens during an annual well check? The services and screenings done by your health care provider during your annual well check will depend on your age, overall health, lifestyle risk factors, and family history of disease. Counseling Your health care provider may ask you questions about your:  Alcohol use.  Tobacco use.  Drug use.  Emotional well-being.  Home and relationship well-being.  Sexual activity.  Eating habits.  Work and work Statistician.  Method of birth control.  Menstrual cycle.  Pregnancy history.  Screening You may have the following tests or measurements:  Height, weight, and BMI.  Diabetes screening. This is done by checking your blood sugar (glucose) after you have not eaten for a while (fasting).  Blood pressure.  Lipid and cholesterol levels. These may be checked every 5 years starting at age 66.  Skin check.  Hepatitis C blood test.  Hepatitis B blood test.  Sexually transmitted disease (STD) testing.  BRCA-related cancer screening. This may be done if you have a family history of breast, ovarian, tubal, or peritoneal cancers.  Pelvic exam and Pap test. This may be done every 3 years starting at age 40. Starting at age 59, this may be done every 5  years if you have a Pap test in combination with an HPV test.  Discuss your test results, treatment options, and if necessary, the need for more tests with your health care provider. Vaccines Your health care provider may recommend certain vaccines, such as:  Influenza vaccine. This is recommended every year.  Tetanus, diphtheria, and acellular pertussis (Tdap, Td) vaccine. You may need a Td booster every 10 years.  Varicella vaccine. You may need this if you have not been vaccinated.  HPV vaccine. If you are 69 or younger, you may need three doses over 6 months.  Measles, mumps, and rubella (MMR) vaccine. You may need at least one dose of MMR. You may also need a second dose.  Pneumococcal 13-valent conjugate (PCV13) vaccine. You may need this if you have certain conditions and were not previously vaccinated.  Pneumococcal polysaccharide (PPSV23) vaccine. You may need one or two doses if you smoke cigarettes or if you have certain conditions.  Meningococcal vaccine. One dose is recommended if you are age 27-21 years and a first-year college student living in a residence hall, or if you have one of several medical conditions. You may also need additional booster doses.  Hepatitis A vaccine. You may need this if you have certain conditions or if you travel or work in places where you may be exposed to hepatitis A.  Hepatitis B vaccine. You may need this if you have certain conditions or if you travel or work in places where you may be exposed to hepatitis B.  Haemophilus influenzae type b (Hib) vaccine. You may need this if  you have certain risk factors.  Talk to your health care provider about which screenings and vaccines you need and how often you need them. This information is not intended to replace advice given to you by your health care provider. Make sure you discuss any questions you have with your health care provider. Document Released: 01/10/2002 Document Revised: 08/03/2016  Document Reviewed: 09/15/2015 Elsevier Interactive Patient Education  2017 [employer].

## 2017-09-05 NOTE — Progress Notes (Signed)
GYNECOLOGY ANNUAL PREVENTATIVE CARE ENCOUNTER NOTE  Subjective:   Rosabelle Jupin is a 31 y.o. G63P1101 female here for a routine annual gynecologic exam.  Current complaints:  none.   Denies abnormal vaginal bleeding, discharge, pelvic pain, problems with intercourse or other gynecologic concerns.    Gynecologic History No LMP recorded (lmp unknown). Patient is not currently having periods (Reason: IUD). Contraception: Skyla IUD placed 05/13/2017. Satisfied with method.   Last Pap: 2015. Results were: normal  Obstetric History OB History  Gravida Para Term Preterm AB Living  2 2 1 1   1   SAB TAB Ectopic Multiple Live Births        0 1    # Outcome Date GA Lbr Len/2nd Weight Sex Delivery Anes PTL Lv  2 Term 04/05/15 [redacted]w[redacted]d 15:38 / 00:11 6 lb 10 oz (3.005 kg) M Vag-Spont EPI  LIV  1 Preterm 04/02/14 [redacted]w[redacted]d  1 lb 3.9 oz (0.563 kg) M Vag-Spont EPI  FD    Obstetric Comments  03/2014 stillborn, vaginal delivery    Past Medical History:  Diagnosis Date  . Fibrocystic breast 2011   2011-2013 monitored  . Hemorrhoid 2015  . Prior pregnancy with fetal demise and current pregnancy   . S/P ACL surgery 2013   complete replacement cadaver acl    Past Surgical History:  Procedure Laterality Date  . ANTERIOR CRUCIATE LIGAMENT REPAIR    . cervical polyp removal  2012    Current Outpatient Prescriptions on File Prior to Visit  Medication Sig Dispense Refill  . dicloxacillin (DYNAPEN) 500 MG capsule Take 1 capsule (500 mg total) by mouth 4 (four) times daily. (Patient not taking: Reported on 09/05/2017) 40 capsule 0  . folic acid (FOLVITE) 1 MG tablet Take 1 tablet (1 mg total) by mouth daily. (Patient not taking: Reported on 09/05/2017) 30 tablet 10  . ibuprofen (ADVIL,MOTRIN) 600 MG tablet Take 1 tablet (600 mg total) by mouth every 6 (six) hours. (Patient not taking: Reported on 09/05/2017) 30 tablet 0  . metroNIDAZOLE (FLAGYL) 500 MG tablet Take 1 tablet (500 mg total) by mouth 2 (two)  times daily. (Patient not taking: Reported on 09/05/2017) 14 tablet 0  . Prenatal Vit-Fe Fumarate-FA (MULTIVITAMIN-PRENATAL) 27-0.8 MG TABS tablet Take 1 tablet by mouth daily at 12 noon.     No current facility-administered medications on file prior to visit.     No Known Allergies  Social History   Social History  . Marital status: Single    Spouse name: N/A  . Number of children: N/A  . Years of education: N/A   Occupational History  . Not on file.   Social History Main Topics  . Smoking status: Never Smoker  . Smokeless tobacco: Never Used  . Alcohol use Yes     Comment: rare  . Drug use: No  . Sexual activity: Yes   Other Topics Concern  . Not on file   Social History Narrative  . No narrative on file    Family History  Problem Relation Age of Onset  . Fibroids Mother   . Fibrocystic breast disease Mother   . Diabetes Father   . Hypertension Maternal Grandmother     The following portions of the patient's history were reviewed and updated as appropriate: allergies, current medications, past family history, past medical history, past social history, past surgical history and problem list.  Review of Systems Pertinent items noted in HPI and remainder of comprehensive ROS otherwise negative.  Objective:  BP 117/83   Pulse 84   Wt 187 lb 6.4 oz (85 kg)   LMP  (LMP Unknown)   BMI 34.28 kg/m  CONSTITUTIONAL: Well-developed, well-nourished female in no acute distress.  HENT:  Normocephalic, atraumatic, External right and left ear normal. Oropharynx is clear and moist EYES: Conjunctivae and EOM are normal. Pupils are equal, round, and reactive to light. No scleral icterus.  NECK: Normal range of motion, supple, no masses.  Normal thyroid.  SKIN: Skin is warm and dry. No rash noted. Not diaphoretic. No erythema. No pallor. NEUROLOGIC: Alert and oriented to person, place, and time. Normal reflexes, muscle tone coordination. No cranial nerve deficit  noted. PSYCHIATRIC: Normal mood and affect. Normal behavior. Normal judgment and thought content. CARDIOVASCULAR: Normal heart rate noted, regular rhythm RESPIRATORY: Clear to auscultation bilaterally. Effort and breath sounds normal, no problems with respiration noted. BREASTS: Symmetric in size. No masses, skin changes, nipple drainage, or lymphadenopathy. ABDOMEN: Soft, normal bowel sounds, no distention noted.  No tenderness, rebound or guarding.  PELVIC: Normal appearing external genitalia; normal appearing vaginal mucosa and cervix.  No abnormal discharge noted. IUD strings seen.  Pap smear obtained.  Normal uterine size, no other palpable masses, no uterine or adnexal tenderness. MUSCULOSKELETAL: Normal range of motion. No tenderness.  No cyanosis, clubbing, or edema.  2+ distal pulses.   Assessment and Plan:  1. Encounter for gynecological examination with Papanicolaou smear of cervix Also desires annual STI screen.   - NuSwab VG+, Candida 6sp - IGP, Aptima HPV, rfx 16/18,45 - HIV antibody - RPR - Hepatitis C antibody - Hepatitis B surface antigen  Will follow up results of pap smear and other tests and manage accordingly. Routine preventative health maintenance measures emphasized. IUD is due for removal next year, patient is aware. Please refer to After Visit Summary for other counseling recommendations.    Verita Schneiders, MD, [place] Attending Obstetrician & Gynecologist, [place] for [facility]

## 2017-09-08 LAB — IGP, APTIMA HPV, RFX 16/18,45
HPV Aptima: NEGATIVE
PAP Smear Comment: 0

## 2017-09-11 LAB — NUSWAB VG+, CANDIDA 6SP
CANDIDA GLABRATA, NAA: NEGATIVE
CANDIDA KRUSEI, NAA: NEGATIVE
CANDIDA TROPICALIS, NAA: NEGATIVE
Candida albicans, NAA: NEGATIVE
Candida lusitaniae, NAA: NEGATIVE
Candida parapsilosis, NAA: NEGATIVE
Chlamydia trachomatis, NAA: NEGATIVE
Neisseria gonorrhoeae, NAA: NEGATIVE
Trich vag by NAA: NEGATIVE

## 2017-09-11 LAB — RPR: RPR Ser Ql: NONREACTIVE

## 2017-09-11 LAB — HIV ANTIBODY (ROUTINE TESTING W REFLEX): HIV Screen 4th Generation wRfx: NONREACTIVE

## 2017-09-11 LAB — HEPATITIS B SURFACE ANTIGEN: Hepatitis B Surface Ag: NEGATIVE

## 2017-09-11 LAB — HEPATITIS C ANTIBODY: Hep C Virus Ab: <0.1 s/co ratio (ref 0.0–0.9)

## 2018-07-27 ENCOUNTER — Ambulatory Visit (INDEPENDENT_AMBULATORY_CARE_PROVIDER_SITE_OTHER): Payer: BLUE CROSS/BLUE SHIELD | Admitting: Obstetrics and Gynecology

## 2018-07-27 ENCOUNTER — Encounter: Payer: Self-pay | Admitting: Obstetrics and Gynecology

## 2018-07-27 ENCOUNTER — Other Ambulatory Visit: Payer: Self-pay

## 2018-07-27 VITALS — BP 119/80 | HR 69 | Wt 182.4 lb

## 2018-07-27 DIAGNOSIS — Z01419 Encounter for gynecological examination (general) (routine) without abnormal findings: Secondary | ICD-10-CM

## 2018-07-27 DIAGNOSIS — Z202 Contact with and (suspected) exposure to infections with a predominantly sexual mode of transmission: Secondary | ICD-10-CM | POA: Diagnosis not present

## 2018-07-27 DIAGNOSIS — Z30432 Encounter for removal of intrauterine contraceptive device: Secondary | ICD-10-CM

## 2018-07-27 NOTE — Procedures (Signed)
Intrauterine Device (IUD) Removal Procedure Note  Skyla IUD placed 04/2015. Prior to the procedure being performed, the patient (or guardian) was asked to state their full name, date of birth, and the type of procedure being performed. EGBUS normal. Vaginal vault normal. Cervix normal with IUD strings seen (approx 3-4cmcm in length). Strings grasped with ringed forceps and easily removed and Skyla noted to be intact.   No complications, patient tolerated the procedure well.  Durene Romans MD Attending [facility] for [facility] (Faculty Practice) 07/27/2018

## 2018-07-27 NOTE — Progress Notes (Signed)
SEND ALL BLOOD WORK TO [facility]

## 2018-07-27 NOTE — Progress Notes (Signed)
Obstetrics and Gynecology Annual Patient Evaluation  Appointment Date: 07/27/2018  OBGYN Clinic: [facility]  Primary Care Provider: Gavin [facility]  Referring Provider: Gavin Pound, MD  Chief Complaint:  Chief Complaint  Patient presents with  . STD TESTING  . IUD REMOVAL    History of Present Illness: Melissa Shah is a 32 y.o. African-American G2P1101 (No LMP recorded. (Menstrual status: IUD).), seen for the above chief complaint. Her past medical history is significant for nothing   Patient would not like to do anything for Melissa Shah once the Melissa Shah comes out today because she wants to give her body a break.   No breast s/s, fevers, chills, chest pain, SOB, nausea, vomiting, abdominal pain, dysuria, hematuria, vaginal itching, diarrhea, constipation  Review of Systems: as noted in the History of Present Illness.  Past Medical History:  Past Medical History:  Diagnosis Date  . Fibrocystic breast 2011   2011-2013 monitored  . Hemorrhoid 2015  . Prior pregnancy with fetal demise and current pregnancy   . S/P ACL surgery 2013   complete replacement cadaver acl    Past Surgical History:  Past Surgical History:  Procedure Laterality Date  . ANTERIOR CRUCIATE LIGAMENT REPAIR    . cervical polyp removal  2012    Past Obstetrical History:  OB History  Gravida Para Term Preterm AB Living  2 2 1 1   1   SAB TAB Ectopic Multiple Live Births        0 1    # Outcome Date GA Lbr Len/2nd Weight Sex Delivery Anes PTL Lv  2 Term 04/05/15 [redacted]w[redacted]d 15:38 / 00:11 6 lb 10 oz (3.005 kg) M Vag-Spont EPI  LIV  1 Preterm 04/02/14 [redacted]w[redacted]d  1 lb 3.9 oz (0.563 kg) M Vag-Spont EPI  FD    Obstetric Comments  03/2014 stillborn, vaginal delivery    Past Gynecological History: As per HPI. Periods: rarely, not heavy or painful History of Pap Smear(s): 2018 NILM, HPV neg She is currently using skla for contraception.   Social History:  Social History   Socioeconomic  History  . Marital status: Single    Spouse name: Not on file  . Number of children: Not on file  . Years of education: Not on file  . Highest education level: Not on file  Occupational History  . Not on file  Social Needs  . Financial resource strain: Not on file  . Food insecurity:    Worry: Not on file    Inability: Not on file  . Transportation needs:    Medical: Not on file    Non-medical: Not on file  Tobacco Use  . Smoking status: Never Smoker  . Smokeless tobacco: Never Used  Substance and Sexual Activity  . Alcohol use: Yes    Comment: rare  . Drug use: No  . Sexual activity: Yes  Lifestyle  . Physical activity:    Days per week: Not on file    Minutes per session: Not on file  . Stress: Not on file  Relationships  . Social connections:    Talks on phone: Not on file    Gets together: Not on file    Attends religious service: Not on file    Active member of club or organization: Not on file    Attends meetings of clubs or organizations: Not on file    Relationship status: Not on file  . Intimate partner violence:    Fear of current or ex partner:  Not on file    Emotionally abused: Not on file    Physically abused: Not on file    Forced sexual activity: Not on file  Other Topics Concern  . Not on file  Social History Narrative  . Not on file    Family History:  Family History  Problem Relation Age of Onset  . Fibroids Mother   . Fibrocystic breast disease Mother   . Diabetes Father   . Hypertension Maternal Grandmother    She denies any female cancers   Medications Melissa [facility] had no medications administered during this visit. Current Outpatient Medications  Medication Sig Dispense Refill  . ibuprofen (ADVIL,MOTRIN) 600 MG tablet Take 1 tablet (600 mg total) by mouth every 6 (six) hours. (Patient not taking: Reported on 09/05/2017) 30 tablet 0   No current facility-administered medications for this visit.     Allergies Patient has no known  allergies.   Physical Exam:  BP 119/80   Pulse 69   Wt 182 lb 6.4 oz (82.7 kg)   BMI 33.36 kg/m  Body mass index is 33.36 kg/m. Weight last year: 187 lbs General appearance: Well nourished, well developed female in no acute distress.  Neck:  Supple, normal appearance, and no thyromegaly  Cardiovascular: normal s1 and s2.  No murmurs, rubs or gallops. Respiratory:  Clear to auscultation bilateral. Normal respiratory effort Abdomen: positive bowel sounds and no masses, hernias; diffusely non tender to palpation, non distended Neuro/Psych:  Normal mood and affect.  Skin:  Warm and dry.  Lymphatic:  No inguinal lymphadenopathy.   Pelvic exam: is not limited by body habitus EGBUS: within normal limits, Vagina: within normal limits and with no blood or discharge in the vault, Cervix: normal appearing cervix without tenderness, discharge or lesions. IUD strings seen-->removed w/o difficulty. Uterus:  nonenlarged and non tender and Adnexa:  normal adnexa and no mass, fullness, tenderness Rectovaginal: deferred  Laboratory: none  Radiology: none  Assessment: pt doing well  Plan:  1. STD exposure - Chlamydia/Gonococcus/Trichomonas, NAA - Beta hCG quant (ref lab) - HIV antibody (with reflex) - Hepatitis B Surface AntiGEN - Hepatitis C Antibody - RPR  2. Encounter for gynecological examination Routine care. Would like STI testing. D/w her and has no contraindications for estrogen containing options and d/w her that if she/d like any option or anything hormone free to call us and let us know and phone in something. Will get beta quant since amenorrheic.  - Beta hCG quant (ref lab) - HIV antibody (with reflex) - Hepatitis B Surface AntiGEN - Hepatitis C Antibody - RPR  RTC 1 year.   Durene Romans MD Attending [facility] [employer])

## 2018-07-28 LAB — HEPATITIS B SURFACE ANTIGEN: Hepatitis B Surface Ag: NEGATIVE

## 2018-07-28 LAB — HEPATITIS C ANTIBODY: Hep C Virus Ab: <0.1 s/co ratio (ref 0.0–0.9)

## 2018-07-28 LAB — RPR: RPR Ser Ql: NONREACTIVE

## 2018-07-28 LAB — HIV ANTIBODY (ROUTINE TESTING W REFLEX): HIV Screen 4th Generation wRfx: NONREACTIVE

## 2018-07-28 LAB — BETA HCG QUANT (REF LAB): hCG Quant: <1 m[IU]/mL

## 2018-08-01 LAB — CHLAMYDIA/GONOCOCCUS/TRICHOMONAS, NAA
CHLAMYDIA BY NAA: NEGATIVE
GONOCOCCUS BY NAA: NEGATIVE
Trich vag by NAA: NEGATIVE

## 2021-07-24 ENCOUNTER — Emergency Department (INDEPENDENT_AMBULATORY_CARE_PROVIDER_SITE_OTHER)
Admission: EM | Admit: 2021-07-24 | Discharge: 2021-07-24 | Disposition: A | Discharge status: Home / Self Care | Payer: 59 | Source: Home / Self Care

## 2021-07-24 ENCOUNTER — Other Ambulatory Visit: Payer: Self-pay

## 2021-07-24 DIAGNOSIS — R590 Localized enlarged lymph nodes: Secondary | ICD-10-CM

## 2021-07-24 NOTE — ED Triage Notes (Signed)
Pt present vaginal irration from shaving. Pt states three days ago she shaved and has some irration below in between her buttock and vagina area. Pt states her lymph nodes are swollen.

## 2021-07-24 NOTE — ED Provider Notes (Signed)
[facility]    CSN: [id number] Arrival date & time: 07/24/21  1542      History   Chief Complaint Chief Complaint  Patient presents with   Groin Swelling    HPI Melissa Shah is a 35 y.o. female.   HPI 35 year old female presents with lymphadenopathy of groin from irritation-shaving vaginal and perineum 3 days ago. Past Medical History:  Diagnosis Date   Fibrocystic breast 2011   2011-2013 monitored   Hemorrhoid 2015   Prior pregnancy with fetal demise and current pregnancy    S/P ACL surgery 2013   complete replacement cadaver acl    There are no problems to display for this patient.   Past Surgical History:  Procedure Laterality Date   ANTERIOR CRUCIATE LIGAMENT REPAIR     cervical polyp removal  2012    OB History     Gravida  2   Para  2   Term  1   Preterm  1   AB      Living  1      SAB      IAB      Ectopic      Multiple  0   Live Births  1        Obstetric Comments  03/2014 stillborn, vaginal delivery          Home Medications    Prior to Admission medications   Medication Sig Start Date End Date Taking? Authorizing Provider  ibuprofen (ADVIL,MOTRIN) 600 MG tablet Take 1 tablet (600 mg total) by mouth every 6 (six) hours. Patient not taking: Reported on 09/05/2017 04/07/15   Janora Norlander, DO    Family History Family History  Problem Relation Age of Onset   Fibroids Mother    Fibrocystic breast disease Mother    Diabetes Father    Hypertension Maternal Grandmother     Social History Social History   Tobacco Use   Smoking status: Never   Smokeless tobacco: Never  Substance Use Topics   Alcohol use: Yes    Comment: rare   Drug use: No     Allergies   Patient has no known allergies.   Review of Systems Review of Systems  Skin:        Painful lymph nodes of suprapubic area from shaving vaginal/perineum area 3 days ago  All other systems reviewed and are negative.   Physical Exam Triage  Vital Signs ED Triage Vitals  Enc Vitals Group     BP 07/24/21 1601 (!) 148/82     Pulse Rate 07/24/21 1601 89     Resp 07/24/21 1601 16     Temp 07/24/21 1601 98.3 F (36.8 C)     Temp Source 07/24/21 1601 Oral     SpO2 07/24/21 1601 99 %     Weight --      Height --      Head Circumference --      Peak Flow --      Pain Score 07/24/21 1602 5     Pain Loc --      Pain Edu? --      Excl. in [place]? --    No data found.  Updated Vital Signs BP (!) 148/82 (BP Location: Right Arm)   Pulse 89   Temp 98.3 F (36.8 C) (Oral)   Resp 16   LMP 07/20/2021   SpO2 99%    Physical Exam Vitals and nursing note reviewed. Exam conducted with a  chaperone present.  Constitutional:      General: She is not in acute distress.    Appearance: Normal appearance. She is normal weight. She is not ill-appearing.  HENT:     Head: Normocephalic and atraumatic.     Mouth/Throat:     Mouth: Mucous membranes are moist.     Pharynx: Oropharynx is clear.  Eyes:     Extraocular Movements: Extraocular movements intact.     Conjunctiva/sclera: Conjunctivae normal.     Pupils: Pupils are equal, round, and reactive to light.  Cardiovascular:     Rate and Rhythm: Normal rate and regular rhythm.     Pulses: Normal pulses.     Heart sounds: Normal heart sounds.  Pulmonary:     Effort: Pulmonary effort is normal.     Breath sounds: Normal breath sounds. No wheezing, rhonchi or rales.  Genitourinary:    Comments: Bilateral inguinal crease/suprapubic region: Multiple bilateral engorged lymph nodes noted on palpation, no skin abnormalities noted Musculoskeletal:        General: Normal range of motion.     Cervical back: Normal range of motion and neck supple.  Skin:    General: Skin is warm and dry.  Neurological:     General: No focal deficit present.     Mental Status: She is alert and oriented to person, place, and time. Mental status is at baseline.  Psychiatric:        Mood and Affect: Mood  normal.        Behavior: Behavior normal.        Thought Content: Thought content normal.     UC Treatments / Results  Labs (all labs ordered are listed, but only abnormal results are displayed) Labs Reviewed - No data to display  EKG   Radiology No results found.  Procedures Procedures (including critical care time)  Medications Ordered in UC Medications - No data to display  Initial Impression / Assessment and Plan / UC Course  I have reviewed the triage vital signs and the nursing notes.  Pertinent labs & imaging results that were available during my care of the patient were reviewed by me and considered in my medical decision making (see chart for details).     MDM: 1.  Lymphadenopathy, inguinal-Advised patient that engorged lymph nodes of groin will resolve on their own.  Advised/encouraged patient to not shave this area for the next 2 to 3 weeks.  Advised/encouraged patient may use OTC Tylenol or OTC ibuprofen for pain.  Patient discharged home, hemodynamically stable. Final Clinical Impressions(s) / UC Diagnoses   Final diagnoses:  Lymphadenopathy, inguinal     Discharge Instructions      Advised patient that engorged lymph nodes of groin will resolve on their own.  Advised/encouraged patient to not shave this area for the next 2 to 3 weeks.  Advised/encouraged patient may use OTC Tylenol or OTC ibuprofen for pain.     ED Prescriptions   None    PDMP not reviewed this encounter.   Eliezer Lofts, [place] 07/24/21 [phone]

## 2021-07-24 NOTE — Discharge Instructions (Addendum)
Advised patient that engorged lymph nodes of groin will resolve on their own.  Advised/encouraged patient to not shave this area for the next 2 to 3 weeks.  Advised/encouraged patient may use OTC Tylenol or OTC ibuprofen for pain.

## 2022-01-31 ENCOUNTER — Other Ambulatory Visit: Payer: Self-pay

## 2022-01-31 ENCOUNTER — Emergency Department (INDEPENDENT_AMBULATORY_CARE_PROVIDER_SITE_OTHER)
Admission: EM | Admit: 2022-01-31 | Discharge: 2022-01-31 | Disposition: A | Discharge status: Home / Self Care | Payer: 59 | Source: Home / Self Care

## 2022-01-31 DIAGNOSIS — R109 Unspecified abdominal pain: Secondary | ICD-10-CM | POA: Diagnosis not present

## 2022-01-31 DIAGNOSIS — R319 Hematuria, unspecified: Secondary | ICD-10-CM | POA: Diagnosis not present

## 2022-01-31 LAB — POCT URINALYSIS DIP (MANUAL ENTRY)
Bilirubin, UA: NEGATIVE
Glucose, UA: NEGATIVE mg/dL
Ketones, POC UA: NEGATIVE mg/dL
Leukocytes, UA: NEGATIVE
Nitrite, UA: NEGATIVE
Protein Ur, POC: NEGATIVE mg/dL
Spec Grav, UA: 1.025 (ref 1.010–1.025)
Urobilinogen, UA: 0.2 E.U./dL
pH, UA: 6 (ref 5.0–8.0)

## 2022-01-31 MED ORDER — SULFAMETHOXAZOLE-TRIMETHOPRIM 800-160 MG PO TABS: 1.0000 | ORAL_TABLET | Freq: Two times a day (BID) | ORAL | 0 refills | Status: AC

## 2022-01-31 NOTE — ED Provider Notes (Signed)
?[facility] ? ? ? ?CSN: [id number] ?Arrival date & time: 01/31/22  1034 ? ? ?  ? ?History   ?Chief Complaint ?Chief Complaint  ?Patient presents with  ? Abdominal Pain  ?  Abdominal pressure and abdominal cramping. X1 week  ? ? ?HPI ?Melissa Shah is a 36 y.o. female.  ? ?HPI 36 year old female presents with abdominal pressure and abdominal cramping for 1 week. ? ?Past Medical History:  ?Diagnosis Date  ? Fibrocystic breast 2011  ? 2011-2013 monitored  ? Hemorrhoid 2015  ? Prior pregnancy with fetal demise and current pregnancy   ? S/P ACL surgery 2013  ? complete replacement cadaver acl  ? ? ?There are no problems to display for this patient. ? ? ?Past Surgical History:  ?Procedure Laterality Date  ? ANTERIOR CRUCIATE LIGAMENT REPAIR    ? cervical polyp removal  2012  ? ? ?OB History   ? ? Gravida  ?2  ? Para  ?2  ? Term  ?1  ? Preterm  ?1  ? AB  ?   ? Living  ?1  ?  ? ? SAB  ?   ? IAB  ?   ? Ectopic  ?   ? Multiple  ?0  ? Live Births  ?1  ?   ?  ? Obstetric Comments  ?03/2014 stillborn, vaginal delivery  ?  ? ?  ? ? ? ?Home Medications   ? ?Prior to Admission medications   ?Medication Sig Start Date End Date Taking? Authorizing Provider  ?Multiple Vitamin (MULTIVITAMIN) tablet Take 1 tablet by mouth daily.   Yes [provider]  ?sulfamethoxazole-trimethoprim (BACTRIM DS) 800-160 MG tablet Take 1 tablet by mouth 2 (two) times daily for 3 days. 01/31/22 02/03/22 Yes Eliezer Lofts, FNP  ?ibuprofen (ADVIL,MOTRIN) 600 MG tablet Take 1 tablet (600 mg total) by mouth every 6 (six) hours. ?Patient not taking: Reported on 09/05/2017 04/07/15   Janora Norlander, DO  ? ? ?Family History ?Family History  ?Problem Relation Age of Onset  ? Fibroids Mother   ? Fibrocystic breast disease Mother   ? Diabetes Father   ? Hypertension Maternal Grandmother   ? ? ?Social History ?Social History  ? ?Tobacco Use  ? Smoking status: Never  ? Smokeless tobacco: Never  ?Substance Use Topics  ? Alcohol use: Yes  ?  Comment: rare   ? Drug use: No  ? ? ? ?Allergies   ?Patient has no known allergies. ? ? ?Review of Systems ?Review of Systems  ?Gastrointestinal:  Positive for abdominal pain.  ?     Abdominal cramping for 1 week  ?All other systems reviewed and are negative. ? ? ?Physical Exam ?Triage Vital Signs ?ED Triage Vitals  ?Enc Vitals Group  ?   BP 01/31/22 1052 117/80  ?   Pulse Rate 01/31/22 1052 88  ?   Resp 01/31/22 1052 20  ?   Temp 01/31/22 1052 98.5 ?F (36.9 ?C)  ?   Temp Source 01/31/22 1052 Oral  ?   SpO2 01/31/22 1052 100 %  ?   Weight 01/31/22 1050 190 lb (86.2 kg)  ?   Height 01/31/22 1050 '5\' 2"'$  (1.575 m)  ?   Head Circumference --   ?   Peak Flow --   ?   Pain Score 01/31/22 1050 6  ?   Pain Loc --   ?   Pain Edu? --   ?   Excl. in [place]? --   ? ?  No data found. ? ?Updated Vital Signs ?BP 117/80 (BP Location: Right Arm)   Pulse 88   Temp 98.5 ?F (36.9 ?C) (Oral)   Resp 20   Ht '5\' 2"'$  (1.575 m)   Wt 190 lb (86.2 kg)   LMP 12/27/2021   SpO2 100%   Breastfeeding No   BMI 34.75 kg/m?  ? ? ?Physical Exam ?Vitals and nursing note reviewed.  ?Constitutional:   ?   Appearance: Normal appearance. She is well-developed. She is obese.  ?HENT:  ?   Head: Normocephalic and atraumatic.  ?   Mouth/Throat:  ?   Mouth: Mucous membranes are moist.  ?Eyes:  ?   Extraocular Movements: Extraocular movements intact.  ?   Conjunctiva/sclera: Conjunctivae normal.  ?   Pupils: Pupils are equal, round, and reactive to light.  ?Cardiovascular:  ?   Rate and Rhythm: Normal rate and regular rhythm.  ?   Heart sounds: Normal heart sounds. No murmur heard. ?Pulmonary:  ?   Effort: Pulmonary effort is normal.  ?   Breath sounds: Normal breath sounds.  ?Abdominal:  ?   General: Abdomen is flat. Bowel sounds are absent.  ?   Palpations: Abdomen is rigid.  ?   Tenderness: There is abdominal tenderness in the suprapubic area. There is no right CVA tenderness, left CVA tenderness, guarding or rebound. Negative signs include Murphy's sign, Rovsing's sign and  McBurney's sign.  ?   Hernia: No hernia is present.  ?Musculoskeletal:  ?   Cervical back: Normal range of motion and neck supple.  ?Skin: ?   General: Skin is warm and dry.  ?Neurological:  ?   General: No focal deficit present.  ?   Mental Status: She is alert and oriented to person, place, and time.  ? ? ? ?UC Treatments / Results  ?Labs ?(all labs ordered are listed, but only abnormal results are displayed) ?Labs Reviewed  ?POCT URINALYSIS DIP (MANUAL ENTRY) - Abnormal; Notable for the following components:  ?    Result Value  ? Blood, UA trace-lysed (*)   ? All other components within normal limits  ?URINE CULTURE  ? ? ?EKG ? ? ?Radiology ?No results found. ? ?Procedures ?Procedures (including critical care time) ? ?Medications Ordered in UC ?Medications - No data to display ? ?Initial Impression / Assessment and Plan / UC Course  ?I have reviewed the triage vital signs and the nursing notes. ? ?Pertinent labs & imaging results that were available during my care of the patient were reviewed by me and considered in my medical decision making (see chart for details). ? ?  ? ?MDM: 1.  Abdominal pain-Rx'd Bactrim.  2. Hematuria-Rx'd Bactrim. Advised patient to take medication as directed with food to completion.  Encouraged patient increase daily water intake while taking this medication.  Advised patient we will follow-up with urine culture results once received.  Patient discharged home, hemodynamically stable. ?Final Clinical Impressions(s) / UC Diagnoses  ? ?Final diagnoses:  ?Abdominal pain, unspecified abdominal location  ?Hematuria, unspecified type  ? ? ? ?Discharge Instructions   ? ?  ?Advised patient to take medication as directed with food to completion.  Encouraged patient increase daily water intake while taking this medication.  Advised patient we will follow-up with urine culture results once received. ? ? ? ? ?ED Prescriptions   ? ? Medication Sig Dispense Auth. Provider  ?  sulfamethoxazole-trimethoprim (BACTRIM DS) 800-160 MG tablet Take 1 tablet by mouth 2 (two) times daily for  3 days. 6 tablet Eliezer Lofts, FNP  ? ?  ? ?PDMP not reviewed this encounter. ?  ?Eliezer Lofts, [place] ?01/31/22 1237 ? ?

## 2022-01-31 NOTE — Discharge Instructions (Addendum)
Advised patient to take medication as directed with food to completion.  Encouraged patient increase daily water intake while taking this medication.  Advised patient we will follow-up with urine culture results once received. ?

## 2022-01-31 NOTE — ED Triage Notes (Signed)
Pt states that she has some abdominal pressure and cramping. X1 week ? ?Pt states that she has had a uti in the past and the symptoms are similar.  ? ? ?

## 2022-02-02 LAB — URINE CULTURE
MICRO NUMBER:: 13091870
Result:: NO GROWTH
SPECIMEN QUALITY:: ADEQUATE

## 2022-04-12 ENCOUNTER — Other Ambulatory Visit (HOSPITAL_COMMUNITY)
Admission: RE | Admit: 2022-04-12 | Discharge: 2022-04-12 | Disposition: A | Discharge status: Home / Self Care | Payer: 59 | Source: Ambulatory Visit | Attending: Obstetrics & Gynecology | Admitting: Obstetrics & Gynecology

## 2022-04-12 ENCOUNTER — Ambulatory Visit (INDEPENDENT_AMBULATORY_CARE_PROVIDER_SITE_OTHER): Payer: 59 | Admitting: Obstetrics & Gynecology

## 2022-04-12 ENCOUNTER — Encounter: Payer: Self-pay | Admitting: Obstetrics & Gynecology

## 2022-04-12 VITALS — BP 132/88 | HR 68 | Wt 191.0 lb

## 2022-04-12 DIAGNOSIS — Z113 Encounter for screening for infections with a predominantly sexual mode of transmission: Secondary | ICD-10-CM | POA: Insufficient documentation

## 2022-04-12 DIAGNOSIS — Z01419 Encounter for gynecological examination (general) (routine) without abnormal findings: Secondary | ICD-10-CM

## 2022-04-12 NOTE — Progress Notes (Signed)
? ? ?GYNECOLOGY ANNUAL PREVENTATIVE CARE ENCOUNTER NOTE ? ?History:    ? Monice Lundy is a 36 y.o. G37P1101 female here for a routine annual gynecologic exam.  Current complaints: none.   Denies abnormal vaginal bleeding, discharge, pelvic pain, problems with intercourse or other gynecologic concerns.  ?  ?Gynecologic History ?Patient's last menstrual period was 04/07/2022 (exact date). ?Contraception: condoms ?Last Pap: 09/05/2017. Result was normal with negative HPV ?Last Mammogram: 07/11/2012.  Result was normal,but she has a stable R breast fibroadenoma ? ?Obstetric History ?OB History  ?Gravida Para Term Preterm AB Living  ?'2 2 1 1   1  '$ ?SAB IAB Ectopic Multiple Live Births  ?      0 1  ?  ?# Outcome Date GA Lbr Len/2nd Weight Sex Delivery Anes PTL Lv  ?2 Term 04/05/15 45w2d15:38 / 00:11 6 lb 10 oz (3.005 kg) M Vag-Spont EPI  LIV  ?1 Preterm 04/02/14 272w4d1 lb 3.9 oz (0.563 kg) M Vag-Spont EPI  FD  ?  ?Obstetric Comments  ?03/2014 stillborn, vaginal delivery  ? ? ?Past Medical History:  ?Diagnosis Date  ? Fibroadenoma of right breast   ? Hemorrhoid 2015  ? Prior pregnancy with fetal demise and current pregnancy   ? ? ?Past Surgical History:  ?Procedure Laterality Date  ? ANTERIOR CRUCIATE LIGAMENT REPAIR    ? cervical polyp removal  2012  ? ? ?Current Outpatient Medications on File Prior to Visit  ?Medication Sig Dispense Refill  ? ibuprofen (ADVIL,MOTRIN) 600 MG tablet Take 1 tablet (600 mg total) by mouth every 6 (six) hours. (Patient not taking: Reported on 09/05/2017) 30 tablet 0  ? Multiple Vitamin (MULTIVITAMIN) tablet Take 1 tablet by mouth daily. (Patient not taking: Reported on 04/12/2022)    ? ?No current facility-administered medications on file prior to visit.  ? ? ?No Known Allergies ? ?Social History:  reports that she has never smoked. She has never used smokeless tobacco. She reports current alcohol use. She reports that she does not use drugs. ? ?Family History  ?Problem Relation Age of Onset  ?  Fibroids Mother   ? Fibrocystic breast disease Mother   ? Diabetes Father   ? Hypertension Maternal Grandmother   ? ? ?The following portions of the patient's history were reviewed and updated as appropriate: allergies, current medications, past family history, past medical history, past social history, past surgical history and problem list. ? ?Review of Systems ?Pertinent items noted in HPI and remainder of comprehensive ROS otherwise negative. ? ?Physical Exam:  ?BP 132/88   Pulse 68   Wt 191 lb (86.6 kg)   LMP 04/07/2022 (Exact Date)   BMI 34.93 kg/m?  ?CONSTITUTIONAL: Well-developed, well-nourished female in no acute distress.  ?HENT:  Normocephalic, atraumatic, External right and left ear normal.  ?EYES: Conjunctivae and EOM are normal. Pupils are equal, round, and reactive to light. No scleral icterus.  ?NECK: Normal range of motion, supple, no masses.  Normal thyroid.  ?SKIN: Skin is warm and dry. No rash noted. Not diaphoretic. No erythema. No pallor. ?MUSCULOSKELETAL: Normal range of motion. No tenderness.  No cyanosis, clubbing, or edema. ?NEUROLOGIC: Alert and oriented to person, place, and time. Normal reflexes, muscle tone coordination.  ?PSYCHIATRIC: Normal mood and affect. Normal behavior. Normal judgment and thought content. ?CARDIOVASCULAR: Normal heart rate noted, regular rhythm ?RESPIRATORY: Clear to auscultation bilaterally. Effort and breath sounds normal, no problems with respiration noted. ?BREASTS: Symmetric in size. Mobile, palpable mass located within the right  breast at the 09:30 o'clock position 3 cm from the nipple which measures 3 cm in size. This fibroadenoma is stable when compared to previous exams.  No other masses, tenderness, skin changes, nipple drainage, or lymphadenopathy bilaterally. Performed in the presence of a chaperone. ?ABDOMEN: Soft, no distention noted.  No tenderness, rebound or guarding.  ?PELVIC: Normal appearing external genitalia and urethral meatus; normal  appearing vaginal mucosa and cervix.  No abnormal vaginal discharge noted.  Pap smear obtained.  Normal uterine size, no other palpable masses, no uterine or adnexal tenderness.  Performed in the presence of a chaperone. ?  ?Assessment and Plan:  ?   ?1. Routine screening for STI (sexually transmitted infection) ?STI screen done as requested, will follow up results and manage accordingly. ?- Cytology - PAP( [place]) ?- RPR+HBsAg+HCVAb+HIV ? ?2. Encounter for well woman exam ?- Cytology - PAP( [place]) ?Will follow up results of pap smear  ?Stable fibroadenoma on exam, screening mammogram recommended at age 50. Earlier imaging if needed. ?Routine preventative health maintenance measures emphasized. ?Please refer to After Visit Summary for other counseling recommendations.  ?   ? ?Verita Schneiders, MD, FACOG ?Obstetrician Social research officer, government, Faculty Practice ?[facility] ? ?

## 2022-04-13 LAB — RPR+HBSAG+HCVAB+...
HIV Screen 4th Generation wRfx: NONREACTIVE
Hep C Virus Ab: NONREACTIVE
Hepatitis B Surface Ag: NEGATIVE
RPR Ser Ql: NONREACTIVE

## 2022-04-15 LAB — CYTOLOGY - PAP
Chlamydia: NEGATIVE
Comment: NEGATIVE
Comment: NEGATIVE
Comment: NORMAL
Diagnosis: NEGATIVE
Diagnosis: REACTIVE
High risk HPV: NEGATIVE
Neisseria Gonorrhea: NEGATIVE
